# Patient Record
Sex: Male | Born: 1965 | Race: Black or African American | Hispanic: No | Marital: Married | Smoking: Light tobacco smoker
Health system: Southern US, Community
[De-identification: ages and names within clinical notes are randomized; demographics above are authoritative.]

## PROBLEM LIST (undated history)

## (undated) DIAGNOSIS — I10 Essential (primary) hypertension: Secondary | ICD-10-CM

## (undated) DIAGNOSIS — G4730 Sleep apnea, unspecified: Secondary | ICD-10-CM

## (undated) DIAGNOSIS — R112 Nausea with vomiting, unspecified: Secondary | ICD-10-CM

## (undated) DIAGNOSIS — B350 Tinea barbae and tinea capitis: Secondary | ICD-10-CM

## (undated) DIAGNOSIS — R918 Other nonspecific abnormal finding of lung field: Secondary | ICD-10-CM

## (undated) DIAGNOSIS — Z9889 Other specified postprocedural states: Secondary | ICD-10-CM

---

## 2014-05-01 ENCOUNTER — Ambulatory Visit: Payer: Self-pay | Admitting: Family Medicine

## 2014-05-08 ENCOUNTER — Ambulatory Visit: Payer: Self-pay | Admitting: Family Medicine

## 2018-12-10 ENCOUNTER — Other Ambulatory Visit: Payer: Self-pay

## 2019-04-30 ENCOUNTER — Other Ambulatory Visit: Payer: Self-pay | Admitting: Sports Medicine

## 2019-04-30 DIAGNOSIS — M25511 Pain in right shoulder: Principal | ICD-10-CM

## 2019-04-30 DIAGNOSIS — S4991XA Unspecified injury of right shoulder and upper arm, initial encounter: Secondary | ICD-10-CM

## 2019-04-30 DIAGNOSIS — G8929 Other chronic pain: Secondary | ICD-10-CM

## 2019-04-30 DIAGNOSIS — M19011 Primary osteoarthritis, right shoulder: Secondary | ICD-10-CM

## 2019-06-02 ENCOUNTER — Other Ambulatory Visit: Payer: Self-pay

## 2019-06-02 ENCOUNTER — Ambulatory Visit
Admission: RE | Admit: 2019-06-02 | Discharge: 2019-06-02 | Disposition: A | Discharge status: Home / Self Care | Payer: Managed Care, Other (non HMO) | Source: Ambulatory Visit | Attending: Sports Medicine | Admitting: Sports Medicine

## 2019-06-02 DIAGNOSIS — M25511 Pain in right shoulder: Secondary | ICD-10-CM | POA: Insufficient documentation

## 2019-06-02 DIAGNOSIS — G8929 Other chronic pain: Secondary | ICD-10-CM | POA: Insufficient documentation

## 2019-06-02 DIAGNOSIS — M19011 Primary osteoarthritis, right shoulder: Secondary | ICD-10-CM | POA: Diagnosis present

## 2019-06-02 DIAGNOSIS — S4991XA Unspecified injury of right shoulder and upper arm, initial encounter: Secondary | ICD-10-CM | POA: Diagnosis present

## 2019-06-24 ENCOUNTER — Encounter
Admission: RE | Admit: 2019-06-24 | Discharge: 2019-06-24 | Disposition: A | Discharge status: Home / Self Care | Payer: Managed Care, Other (non HMO) | Source: Ambulatory Visit | Attending: Orthopedic Surgery | Admitting: Orthopedic Surgery

## 2019-06-24 ENCOUNTER — Other Ambulatory Visit: Payer: Self-pay

## 2019-06-24 HISTORY — DX: Essential (primary) hypertension: I10

## 2019-06-24 NOTE — Patient Instructions (Addendum)
Your procedure is scheduled on: 06/30/2019 Mon Report to Same Day Surgery 2nd floor medical mall [facility](Medical Mall Entrance-take elevator on left to 2nd floor.  Check in with surgery information desk.) To find out your arrival time please call [phone] between 1PM - 3PM on 06/27/2019 Fri  Remember: Instructions that are not followed completely may result in serious medical risk, up to and including death, or upon the discretion of your surgeon and anesthesiologist your surgery may need to be rescheduled.    _x___ 1. Do not eat food after midnight the night before your procedure. You may drink clear liquids up to 2 hours before you are scheduled to arrive at the hospital for your procedure.  Do not drink clear liquids within 2 hours of your scheduled arrival to the hospital.  Clear liquids include  --Water or Apple juice without pulp  --Clear carbohydrate beverage such as ClearFast or Gatorade  --Black Coffee or Clear Tea (No milk, no creamers, do not add anything to                  the coffee or Tea Type 1 and type 2 diabetics should only drink water.   ____Ensure clear carbohydrate drink on the way to the hospital for bariatric patients  ____Ensure clear carbohydrate drink 3 hours before surgery for Dr Rutherford NailByrnett's patients if physician instructed.   No gum chewing or hard candies.     __x__ 2. No Alcohol for 24 hours before or after surgery.   __x__3. No Smoking or e-cigarettes for 24 prior to surgery.  Do not use any chewable tobacco products for at least 6 hour prior to surgery   ____  4. Bring all medications with you on the day of surgery if instructed.    __x__ 5. Notify your doctor if there is any change in your medical condition     (cold, fever, infections).    x___6. On the morning of surgery brush your teeth with toothpaste and water.  You may rinse your mouth with mouth wash if you wish.  Do not swallow any toothpaste or mouthwash.   Do not wear jewelry, make-up, hairpins,  clips or nail polish.  Do not wear lotions, powders, or perfumes. You may wear deodorant.  Do not shave 48 hours prior to surgery. Men may shave face and neck.  Do not bring valuables to the hospital.    [facility] is not responsible for any belongings or valuables.               Contacts, dentures or bridgework may not be worn into surgery.  Leave your suitcase in the car. After surgery it may be brought to your room.  For patients admitted to the hospital, discharge time is determined by your                       treatment team.  _  Patients discharged the day of surgery will not be allowed to drive home.  You will need someone to drive you home and stay with you the night of your procedure.    Please read over the following fact sheets that you were given:   [facility] Pre[facility] and or MRSA Information   _x___ Take anti-hypertensive listed below, cardiac, seizure, asthma,     anti-reflux and psychiatric medicines. These include:  1. none  2.  3.  4.  5.  6.  ____Fleets enema or Magnesium Citrate as directed.  _x___ Use CHG Soap or sage wipes as directed on instruction sheet   ____ Use inhalers on the day of surgery and bring to hospital day of surgery  ____ Stop Metformin and Janumet 2 days prior to surgery.    ____ Take 1/2 of usual insulin dose the night before surgery and none on the morning     surgery.   _x___ Follow recommendations from Cardiologist, Pulmonologist or PCP regarding          stopping Aspirin, Coumadin, Plavix ,Eliquis, Effient, or Pradaxa, and Pletal.  X____Stop Anti-inflammatories such as Advil, Aleve, Ibuprofen, Motrin, Naproxen, Naprosyn, Goodies powders or aspirin products. OK to take Tylenol and                          Celebrex.   _x___ Stop supplements until after surgery.  But may continue Vitamin D, Vitamin B,       and multivitamin.n Stop fish oil and garlic today   _x___ Bring C-Pap to the hospital.

## 2019-06-26 ENCOUNTER — Other Ambulatory Visit: Payer: Self-pay

## 2019-06-26 ENCOUNTER — Encounter
Admission: RE | Admit: 2019-06-26 | Discharge: 2019-06-26 | Disposition: A | Discharge status: Home / Self Care | Payer: Managed Care, Other (non HMO) | Source: Ambulatory Visit | Attending: Orthopedic Surgery | Admitting: Orthopedic Surgery

## 2019-06-26 DIAGNOSIS — R9431 Abnormal electrocardiogram [ECG] [EKG]: Secondary | ICD-10-CM | POA: Diagnosis not present

## 2019-06-26 DIAGNOSIS — Z1159 Encounter for screening for other viral diseases: Secondary | ICD-10-CM | POA: Diagnosis not present

## 2019-06-26 DIAGNOSIS — I10 Essential (primary) hypertension: Secondary | ICD-10-CM

## 2019-06-26 DIAGNOSIS — Z01818 Encounter for other preprocedural examination: Secondary | ICD-10-CM | POA: Diagnosis present

## 2019-06-26 LAB — SARS CORONAVIRUS 2 (TAT 6-24 HRS): SARS Coronavirus 2: NEGATIVE

## 2019-06-30 ENCOUNTER — Other Ambulatory Visit: Payer: Self-pay

## 2019-06-30 ENCOUNTER — Ambulatory Visit
Admission: RE | Admit: 2019-06-30 | Discharge: 2019-06-30 | Disposition: A | Discharge status: Home / Self Care | Payer: Managed Care, Other (non HMO) | Attending: Orthopedic Surgery | Admitting: Orthopedic Surgery

## 2019-06-30 ENCOUNTER — Ambulatory Visit: Payer: Managed Care, Other (non HMO) | Admitting: Anesthesiology

## 2019-06-30 ENCOUNTER — Encounter
Admission: RE | Disposition: A | Discharge status: Home / Self Care | Payer: Self-pay | Source: Home / Self Care | Attending: Orthopedic Surgery

## 2019-06-30 ENCOUNTER — Encounter: Payer: Self-pay | Admitting: Emergency Medicine

## 2019-06-30 DIAGNOSIS — I10 Essential (primary) hypertension: Secondary | ICD-10-CM | POA: Diagnosis not present

## 2019-06-30 DIAGNOSIS — F17210 Nicotine dependence, cigarettes, uncomplicated: Secondary | ICD-10-CM | POA: Diagnosis not present

## 2019-06-30 DIAGNOSIS — Z79899 Other long term (current) drug therapy: Secondary | ICD-10-CM | POA: Insufficient documentation

## 2019-06-30 DIAGNOSIS — Y9323 Activity, snow (alpine) (downhill) skiing, snow boarding, sledding, tobogganing and snow tubing: Secondary | ICD-10-CM | POA: Diagnosis not present

## 2019-06-30 DIAGNOSIS — W000XXA Fall on same level due to ice and snow, initial encounter: Secondary | ICD-10-CM | POA: Diagnosis not present

## 2019-06-30 DIAGNOSIS — M65811 Other synovitis and tenosynovitis, right shoulder: Secondary | ICD-10-CM | POA: Diagnosis not present

## 2019-06-30 DIAGNOSIS — M7581 Other shoulder lesions, right shoulder: Secondary | ICD-10-CM | POA: Insufficient documentation

## 2019-06-30 DIAGNOSIS — M7541 Impingement syndrome of right shoulder: Secondary | ICD-10-CM | POA: Diagnosis not present

## 2019-06-30 DIAGNOSIS — S46012A Strain of muscle(s) and tendon(s) of the rotator cuff of left shoulder, initial encounter: Secondary | ICD-10-CM | POA: Diagnosis present

## 2019-06-30 DIAGNOSIS — G4730 Sleep apnea, unspecified: Secondary | ICD-10-CM | POA: Insufficient documentation

## 2019-06-30 HISTORY — PX: SHOULDER ARTHROSCOPY WITH SUBACROMIAL DECOMPRESSION, ROTATOR CUFF REPAIR AND BICEP TENDON REPAIR: SHX5687

## 2019-06-30 HISTORY — DX: Other specified postprocedural states: Z98.890

## 2019-06-30 HISTORY — DX: Nausea with vomiting, unspecified: R11.2

## 2019-06-30 SURGERY — SHOULDER ARTHROSCOPY WITH SUBACROMIAL DECOMPRESSION, ROTATOR CUFF REPAIR AND BICEP TENDON REPAIR
Anesthesia: General | Site: Shoulder | Laterality: Right

## 2019-06-30 MED ORDER — SODIUM CHLORIDE FLUSH 0.9 % IV SOLN
INTRAVENOUS | Status: AC
  Filled 2019-06-30: qty 10

## 2019-06-30 MED ORDER — ONDANSETRON 4 MG PO TBDP: 4.0000 mg | ORAL_TABLET | Freq: Three times a day (TID) | ORAL | 0 refills | Status: DC | PRN

## 2019-06-30 MED ORDER — MIDAZOLAM HCL 2 MG/2ML IJ SOLN
INTRAMUSCULAR | Status: DC | PRN
  Administered 2019-06-30: 2 mg via INTRAVENOUS

## 2019-06-30 MED ORDER — PROMETHAZINE HCL 25 MG/ML IJ SOLN
6.2500 mg | Freq: Once | INTRAMUSCULAR | Status: AC
  Administered 2019-06-30: 12:00:00 6.25 mg via INTRAVENOUS

## 2019-06-30 MED ORDER — MIDAZOLAM HCL 2 MG/2ML IJ SOLN
1.0000 mg | Freq: Once | INTRAMUSCULAR | Status: AC
  Administered 2019-06-30: 1 mg via INTRAVENOUS

## 2019-06-30 MED ORDER — MIDAZOLAM HCL 2 MG/2ML IJ SOLN
INTRAMUSCULAR | Status: AC
  Filled 2019-06-30: qty 2

## 2019-06-30 MED ORDER — ROCURONIUM BROMIDE 100 MG/10ML IV SOLN
INTRAVENOUS | Status: DC | PRN
  Administered 2019-06-30: 50 mg via INTRAVENOUS
  Administered 2019-06-30: 30 mg via INTRAVENOUS

## 2019-06-30 MED ORDER — FENTANYL CITRATE (PF) 100 MCG/2ML IJ SOLN
INTRAMUSCULAR | Status: AC
  Administered 2019-06-30: 50 ug via INTRAVENOUS
  Filled 2019-06-30: qty 2

## 2019-06-30 MED ORDER — BUPIVACAINE HCL (PF) 0.5 % IJ SOLN
INTRAMUSCULAR | Status: DC | PRN
  Administered 2019-06-30: 7 mL via PERINEURAL
  Administered 2019-06-30: 3 mL via PERINEURAL

## 2019-06-30 MED ORDER — BUPIVACAINE LIPOSOME 1.3 % IJ SUSP
INTRAMUSCULAR | Status: AC
  Filled 2019-06-30: qty 20

## 2019-06-30 MED ORDER — EPINEPHRINE (ANAPHYLAXIS) 30 MG/30ML IJ SOLN
INTRAMUSCULAR | Status: AC
  Filled 2019-06-30: qty 30

## 2019-06-30 MED ORDER — FAMOTIDINE 20 MG PO TABS
ORAL_TABLET | ORAL | Status: AC
  Administered 2019-06-30: 20 mg via ORAL
  Filled 2019-06-30: qty 1

## 2019-06-30 MED ORDER — CEFAZOLIN SODIUM-DEXTROSE 2-4 GM/100ML-% IV SOLN
2.0000 g | Freq: Once | INTRAVENOUS | Status: AC
  Administered 2019-06-30: 2 g via INTRAVENOUS

## 2019-06-30 MED ORDER — BUPIVACAINE LIPOSOME 1.3 % IJ SUSP
INTRAMUSCULAR | Status: DC | PRN
  Administered 2019-06-30: 13 mL via PERINEURAL
  Administered 2019-06-30: 7 mL via PERINEURAL

## 2019-06-30 MED ORDER — PHENYLEPHRINE HCL (PRESSORS) 10 MG/ML IV SOLN
INTRAVENOUS | Status: DC | PRN
  Administered 2019-06-30: 50 ug via INTRAVENOUS

## 2019-06-30 MED ORDER — SUGAMMADEX SODIUM 200 MG/2ML IV SOLN
INTRAVENOUS | Status: DC | PRN
  Administered 2019-06-30: 150 mg via INTRAVENOUS

## 2019-06-30 MED ORDER — CEFAZOLIN SODIUM-DEXTROSE 2-4 GM/100ML-% IV SOLN
INTRAVENOUS | Status: AC
  Filled 2019-06-30: qty 100

## 2019-06-30 MED ORDER — SUGAMMADEX SODIUM 200 MG/2ML IV SOLN
INTRAVENOUS | Status: AC
  Filled 2019-06-30: qty 2

## 2019-06-30 MED ORDER — PROMETHAZINE HCL 25 MG/ML IJ SOLN
INTRAMUSCULAR | Status: AC
  Administered 2019-06-30: 6.25 mg via INTRAVENOUS
  Filled 2019-06-30: qty 1

## 2019-06-30 MED ORDER — LIDOCAINE HCL (PF) 2 % IJ SOLN
INTRAMUSCULAR | Status: AC
  Filled 2019-06-30: qty 10

## 2019-06-30 MED ORDER — OXYCODONE HCL 5 MG PO TABS: 5.0000 mg | ORAL_TABLET | Freq: Once | ORAL | Status: DC | PRN

## 2019-06-30 MED ORDER — FENTANYL CITRATE (PF) 100 MCG/2ML IJ SOLN: 25.0000 ug | INTRAMUSCULAR | Status: DC | PRN

## 2019-06-30 MED ORDER — OXYCODONE HCL 5 MG/5ML PO SOLN: 5.0000 mg | Freq: Once | ORAL | Status: DC | PRN

## 2019-06-30 MED ORDER — FENTANYL CITRATE (PF) 100 MCG/2ML IJ SOLN
50.0000 ug | Freq: Once | INTRAMUSCULAR | Status: AC
  Administered 2019-06-30: 50 ug via INTRAVENOUS

## 2019-06-30 MED ORDER — LACTATED RINGERS IV SOLN
INTRAVENOUS | Status: DC
  Administered 2019-06-30: 06:00:00 via INTRAVENOUS

## 2019-06-30 MED ORDER — OXYCODONE HCL 5 MG PO TABS: 5.0000 mg | ORAL_TABLET | ORAL | 0 refills | Status: AC | PRN

## 2019-06-30 MED ORDER — ONDANSETRON HCL 4 MG/2ML IJ SOLN
INTRAMUSCULAR | Status: DC | PRN
  Administered 2019-06-30: 4 mg via INTRAVENOUS

## 2019-06-30 MED ORDER — BUPIVACAINE HCL (PF) 0.5 % IJ SOLN
INTRAMUSCULAR | Status: AC
  Filled 2019-06-30: qty 10

## 2019-06-30 MED ORDER — LACTATED RINGERS IV SOLN
INTRAVENOUS | Status: DC | PRN
  Administered 2019-06-30: 10 mL

## 2019-06-30 MED ORDER — MIDAZOLAM HCL 2 MG/2ML IJ SOLN
INTRAMUSCULAR | Status: AC
  Administered 2019-06-30: 1 mg via INTRAVENOUS
  Filled 2019-06-30: qty 2

## 2019-06-30 MED ORDER — BUPIVACAINE HCL (PF) 0.5 % IJ SOLN
INTRAMUSCULAR | Status: AC
  Filled 2019-06-30: qty 30

## 2019-06-30 MED ORDER — ASPIRIN EC 325 MG PO TBEC: 325.0000 mg | DELAYED_RELEASE_TABLET | Freq: Every day | ORAL | 0 refills | Status: AC

## 2019-06-30 MED ORDER — ACETAMINOPHEN 500 MG PO TABS: 1000.0000 mg | ORAL_TABLET | Freq: Three times a day (TID) | ORAL | 2 refills | Status: AC

## 2019-06-30 MED ORDER — LIDOCAINE HCL (PF) 1 % IJ SOLN
INTRAMUSCULAR | Status: AC
  Filled 2019-06-30: qty 5

## 2019-06-30 MED ORDER — LACTATED RINGERS IV SOLN
INTRAVENOUS | Status: DC | PRN
  Administered 2019-06-30: 10:00:00 via INTRAVENOUS

## 2019-06-30 MED ORDER — ONDANSETRON HCL 4 MG/2ML IJ SOLN
INTRAMUSCULAR | Status: AC
  Filled 2019-06-30: qty 2

## 2019-06-30 MED ORDER — LIDOCAINE HCL (CARDIAC) PF 100 MG/5ML IV SOSY
PREFILLED_SYRINGE | INTRAVENOUS | Status: DC | PRN
  Administered 2019-06-30: 60 mg via INTRAVENOUS

## 2019-06-30 MED ORDER — PROPOFOL 10 MG/ML IV BOLUS
INTRAVENOUS | Status: DC | PRN
  Administered 2019-06-30: 150 mg via INTRAVENOUS

## 2019-06-30 MED ORDER — LIDOCAINE-EPINEPHRINE (PF) 1 %-1:200000 IJ SOLN
INTRAMUSCULAR | Status: AC
  Filled 2019-06-30: qty 30

## 2019-06-30 MED ORDER — FENTANYL CITRATE (PF) 100 MCG/2ML IJ SOLN
INTRAMUSCULAR | Status: DC | PRN
  Administered 2019-06-30 (×2): 50 ug via INTRAVENOUS

## 2019-06-30 MED ORDER — FENTANYL CITRATE (PF) 100 MCG/2ML IJ SOLN
INTRAMUSCULAR | Status: AC
  Filled 2019-06-30: qty 2

## 2019-06-30 MED ORDER — DEXAMETHASONE SODIUM PHOSPHATE 10 MG/ML IJ SOLN
INTRAMUSCULAR | Status: DC | PRN
  Administered 2019-06-30: 5 mg via INTRAVENOUS

## 2019-06-30 MED ORDER — ROCURONIUM BROMIDE 50 MG/5ML IV SOLN
INTRAVENOUS | Status: AC
  Filled 2019-06-30: qty 1

## 2019-06-30 MED ORDER — PROPOFOL 10 MG/ML IV BOLUS
INTRAVENOUS | Status: AC
  Filled 2019-06-30: qty 40

## 2019-06-30 MED ORDER — DEXAMETHASONE SODIUM PHOSPHATE 10 MG/ML IJ SOLN
INTRAMUSCULAR | Status: AC
  Filled 2019-06-30: qty 1

## 2019-06-30 MED ORDER — FAMOTIDINE 20 MG PO TABS
20.0000 mg | ORAL_TABLET | Freq: Once | ORAL | Status: AC
  Administered 2019-06-30: 06:00:00 20 mg via ORAL

## 2019-06-30 MED ORDER — LIDOCAINE-EPINEPHRINE 1 %-1:100000 IJ SOLN
INTRAMUSCULAR | Status: AC
  Filled 2019-06-30: qty 1

## 2019-06-30 SURGICAL SUPPLY — 85 items
ADAPTER IRRIG TUBE 2 SPIKE SOL (ADAPTER) ×6 IMPLANT
ANCHOR 2.3 SP SGL 1.2 XBRAID (Anchor) ×8 IMPLANT
ANCHOR 2.3MM SP SGL 1.2 XBRAID (Anchor) ×4 IMPLANT
ANCHOR SUT BIO SW 4.75X19.1 (Anchor) ×9 IMPLANT
ANCHOR SUT FBRTK SUTURETAP 1.3 (Anchor) ×3 IMPLANT
ARTHREX INC FIBER TAPE 2MM ×2 IMPLANT
BIT DRILL RIGD1.8MM FBRTK STRL (DRILL) ×1 IMPLANT
BLADE OSCILLATING/SAGITTAL (BLADE)
BLADE SW THK.38XMED LNG THN (BLADE) IMPLANT
BUR BR 5.5 12 FLUTE (BURR) ×3 IMPLANT
BUR RADIUS 4.0X18.5 (BURR) ×3 IMPLANT
CANNULA 5.75X7CM (CANNULA)
CANNULA PART THRD DISP 5.75X7 (CANNULA) IMPLANT
CANNULA PARTIAL THREAD 2X7 (CANNULA) ×3 IMPLANT
CANNULA TWIST IN 8.25X9CM (CANNULA) IMPLANT
CHLORAPREP W/TINT 26 (MISCELLANEOUS) ×3 IMPLANT
CLOSURE WOUND 1/2 X4 (GAUZE/BANDAGES/DRESSINGS) ×1
COOLER POLAR GLACIER W/PUMP (MISCELLANEOUS) ×3 IMPLANT
COVER WAND RF STERILE (DRAPES) ×3 IMPLANT
CRADLE LAMINECT ARM (MISCELLANEOUS) ×3 IMPLANT
DERMABOND ADVANCED (GAUZE/BANDAGES/DRESSINGS) ×2
DERMABOND ADVANCED .7 DNX12 (GAUZE/BANDAGES/DRESSINGS) ×1 IMPLANT
DRAPE IMP U-DRAPE 54X76 (DRAPES) ×6 IMPLANT
DRAPE INCISE IOBAN 66X45 STRL (DRAPES) ×3 IMPLANT
DRAPE SHEET LG 3/4 BI-LAMINATE (DRAPES) ×3 IMPLANT
DRAPE STERI 35X30 U-POUCH (DRAPES) ×3 IMPLANT
DRAPE U-SHAPE 47X51 STRL (DRAPES) ×6 IMPLANT
DRILL RIGID 1.8MM FBRTK STRL (DRILL) ×3
DRSG TEGADERM 4X4.75 (GAUZE/BANDAGES/DRESSINGS) ×9 IMPLANT
ELECT REM PT RETURN 9FT ADLT (ELECTROSURGICAL) ×3
ELECTRODE REM PT RTRN 9FT ADLT (ELECTROSURGICAL) ×1 IMPLANT
FIBER TAPE 2MM (SUTURE) ×3 IMPLANT
GAUZE SPONGE 4X4 12PLY STRL (GAUZE/BANDAGES/DRESSINGS) ×3 IMPLANT
GAUZE XEROFORM 1X8 LF (GAUZE/BANDAGES/DRESSINGS) ×3 IMPLANT
GLOVE BIO SURGEON STRL SZ7.5 (GLOVE) ×3 IMPLANT
GLOVE BIOGEL PI IND STRL 8 (GLOVE) ×4 IMPLANT
GLOVE BIOGEL PI INDICATOR 8 (GLOVE) ×8
GLOVE SURG SYN 8.0 (GLOVE) ×6 IMPLANT
GOWN STRL REUS W/ TWL LRG LVL3 (GOWN DISPOSABLE) ×3 IMPLANT
GOWN STRL REUS W/TWL LRG LVL3 (GOWN DISPOSABLE) ×6
GOWN STRL REUS W/TWL LRG LVL4 (GOWN DISPOSABLE) ×3 IMPLANT
IV LACTATED RINGER IRRG 3000ML (IV SOLUTION) ×20
IV LR IRRIG 3000ML ARTHROMATIC (IV SOLUTION) ×10 IMPLANT
KIT SPEAR STR 1.6MM DRILL (MISCELLANEOUS) ×3 IMPLANT
KIT STABILIZATION SHOULDER (MISCELLANEOUS) ×3 IMPLANT
KIT SUTURETAK 3.0 INSERT PERC (KITS) IMPLANT
KIT TURNOVER KIT A (KITS) ×3 IMPLANT
MANIFOLD NEPTUNE II (INSTRUMENTS) ×3 IMPLANT
MASK FACE SPIDER DISP (MASK) ×3 IMPLANT
MAT ABSORB  FLUID 56X50 GRAY (MISCELLANEOUS) ×4
MAT ABSORB FLUID 56X50 GRAY (MISCELLANEOUS) ×2 IMPLANT
NDL MAYO CATGUT SZ5 (NEEDLE) ×2
NDL SAFETY ECLIPSE 18X1.5 (NEEDLE) ×1 IMPLANT
NDL SUT 5 .5 CRC TPR PNT MAYO (NEEDLE) ×1 IMPLANT
NEEDLE HYPO 18GX1.5 SHARP (NEEDLE) ×2
NEEDLE HYPO 22GX1.5 SAFETY (NEEDLE) ×3 IMPLANT
NEEDLE MAYO CATGUT SZ 1.5 (NEEDLE) ×3
NEEDLE MAYO CATGUT SZ 2 (NEEDLE) ×1 IMPLANT
NEEDLE SCORPION MULTI FIRE (NEEDLE) ×3 IMPLANT
PACK ARTHROSCOPY SHOULDER (MISCELLANEOUS) ×3 IMPLANT
PAD ABD DERMACEA PRESS 5X9 (GAUZE/BANDAGES/DRESSINGS) ×3 IMPLANT
PAD WRAPON POLAR SHDR XLG (MISCELLANEOUS) ×1 IMPLANT
SET TUBE SUCT SHAVER OUTFL 24K (TUBING) ×3 IMPLANT
SET TUBE TIP INTRA-ARTICULAR (MISCELLANEOUS) ×3 IMPLANT
SLING ULTRA II M (MISCELLANEOUS) IMPLANT
STAPLER SKIN PROX 35W (STAPLE) IMPLANT
STRAP SAFETY 5IN WIDE (MISCELLANEOUS) ×3 IMPLANT
STRIP CLOSURE SKIN 1/2X4 (GAUZE/BANDAGES/DRESSINGS) ×2 IMPLANT
SUT ETHILON 3-0 (SUTURE) ×3 IMPLANT
SUT LASSO 90 DEG SD STR (SUTURE) IMPLANT
SUT MNCRL 4-0 (SUTURE) ×2
SUT MNCRL 4-0 27XMFL (SUTURE) ×1
SUT PROLENE 0 CT 2 (SUTURE) IMPLANT
SUT VIC AB 0 CT1 36 (SUTURE) ×3 IMPLANT
SUT VIC AB 2-0 CT2 27 (SUTURE) ×3 IMPLANT
SUT VICRYL 3-0 27IN (SUTURE) ×3 IMPLANT
SUTURE MNCRL 4-0 27XMF (SUTURE) ×1 IMPLANT
SYR 10ML LL (SYRINGE) ×3 IMPLANT
TAPE CLOTH 3X10 WHT NS LF (GAUZE/BANDAGES/DRESSINGS) ×3 IMPLANT
TAPE MICROFOAM 4IN (TAPE) ×3 IMPLANT
TUBING ARTHRO INFLOW-ONLY STRL (TUBING) ×3 IMPLANT
TUBING CONNECTING 10 (TUBING) IMPLANT
TUBING CONNECTING 10' (TUBING)
WAND WEREWOLF FLOW 90D (MISCELLANEOUS) ×3 IMPLANT
WRAPON POLAR PAD SHDR XLG (MISCELLANEOUS) ×3

## 2019-06-30 NOTE — Discharge Instructions (Signed)
Post-Op Instructions - Rotator Cuff Repair ° °1. Bracing: You will wear a shoulder immobilizer or sling for 6 weeks.  ° °2. Driving: No driving for 3 weeks post-op. When driving, do not wear the immobilizer. Ideally, we recommend no driving for 6 weeks while sling is in place as one arm will be immobilized.  ° °3. Activity: No active lifting for 2 months. Wrist, hand, and elbow motion only. Avoid lifting the upper arm away from the body except for hygiene. You are permitted to bend and straighten the elbow passively only (no active elbow motion). You may use your hand and wrist for typing, writing, and managing utensils (cutting food). Do not lift more than a coffee cup for 8 weeks.  When sleeping or resting, inclined positions (recliner chair or wedge pillow) and a pillow under the forearm for support may provide better comfort for up to 4 weeks.  Avoid long distance travel for 4 weeks. ° °Return to normal activities after rotator cuff repair repair normally takes 6 months on average. If rehab goes very well, may be able to do most activities at 4 months, except overhead or contact sports. ° °4. Physical Therapy: Begins 3-4 days after surgery, and proceed 1 time per week for the first 6 weeks, then 1-2 times per week from weeks 6-20 post-op. ° °5. Medications:  °- You will be provided a prescription for narcotic pain medicine. After surgery, take 1-2 narcotic tablets every 4 hours if needed for severe pain.  °- A prescription for anti-nausea medication will be provided in case the narcotic medicine causes nausea - take 1 tablet every 6 hours only if nauseated.   °- Take tylenol 1000 mg (2 Extra Strength tablets or 3 regular strength) every 8 hours for pain.  May decrease or stop tylenol 5 days after surgery if you are having minimal pain. °- Take ASA 325mg/day x 2 weeks to help prevent DVTs/PEs (blood clots).  °- DO NOT take ANY nonsteroidal anti-inflammatory pain medications (Advil, Motrin, Ibuprofen, Aleve,  Naproxen, or Naprosyn). These medicines can inhibit healing of your shoulder repair.  ° ° °If you are taking prescription medication for anxiety, depression, insomnia, muscle spasm, chronic pain, or for attention deficit disorder, you are advised that you are at a higher risk of adverse effects with use of narcotics post-op, including narcotic addiction/dependence, depressed breathing, death. °If you use non-prescribed substances: alcohol, marijuana, cocaine, heroin, methamphetamines, etc., you are at a higher risk of adverse effects with use of narcotics post-op, including narcotic addiction/dependence, depressed breathing, death. °You are advised that taking > 50 morphine milligram equivalents (MME) of narcotic pain medication per day results in twice the risk of overdose or death. For your prescription provided: oxycodone 5 mg - taking more than 6 tablets per day would result in > 50 morphine milligram equivalents (MME) of narcotic pain medication. °Be advised that we will prescribe narcotics short-term, for acute post-operative pain only - 3 weeks for major operations such as shoulder repair/reconstruction surgeries.  ° ° ° °6. Post-Op Appointment: ° °Your first post-op appointment will be 10-14 days post-op. ° °7. Work or School: For most, but not all procedures, we advise staying out of work or school for at least 1 to 2 weeks in order to recover from the stress of surgery and to allow time for healing.  ° °If you need a work or school note this can be provided.  ° °8. Smoking: If you are a smoker, you need to refrain from   smoking in the postoperative period. The nicotine in cigarettes will inhibit healing of your shoulder repair and decrease the chance of successful repair. Similarly, nicotine containing products (gum, patches) should be avoided.  ° °Post-operative Brace: °Apply and remove the brace you received as you were instructed to at the time of fitting and as described in detail as the brace’s  instructions for use indicate.  Wear the brace for the period of time prescribed by your physician.  The brace can be cleaned with soap and water and allowed to air dry only.  Should the brace result in increased pain, decreased feeling (numbness/tingling), increased swelling or an overall worsening of your medical condition, please contact your doctor immediately.  If an emergency situation occurs as a result of wearing the brace after normal business hours, please dial 911 and seek immediate medical attention.  Let your doctor know if you have any further questions about the brace issued to you. °Refer to the shoulder sling instructions for use if you have any questions regarding the correct fit of your shoulder sling.  °[facility] Customer Care for Troubleshooting: [phone] ° °Video that illustrates how to properly use a shoulder sling: °"Instructions for Proper Use of an Orthopaedic Sling" °https://www.youtube.com/watch?v=AHZpn_Xo45w ° ° °AMBULATORY SURGERY  °DISCHARGE INSTRUCTIONS ° ° °1) The drugs that you were given will stay in your system until tomorrow so for the next 24 hours you should not: ° °A) Drive an automobile °B) Make any legal decisions °C) Drink any alcoholic beverage ° ° °2) You may resume regular meals tomorrow.  Today it is better to start with liquids and gradually work up to solid foods. ° °You may eat anything you prefer, but it is better to start with liquids, then soup and crackers, and gradually work up to solid foods. ° ° °3) Please notify your doctor immediately if you have any unusual bleeding, trouble breathing, redness and pain at the surgery site, drainage, fever, or pain not relieved by medication. ° ° ° °4) Additional Instructions: ° ° ° ° ° ° ° °Please contact your physician with any problems or Same Day Surgery at [phone], Monday through Friday 6 am to 4 pm, or [facility] Main number at [phone]. ° ° °

## 2019-06-30 NOTE — Op Note (Signed)
SURGERY DATE: 06/30/2019  PRE-OP DIAGNOSIS:  1. Right rotator cuff tear (subscapularis, supraspinatus, infraspinatus) 2. Right subacromial impingement 3. Right biceps tendinopathy  POST-OP DIAGNOSIS: 1. Right rotator cuff tear (subscapularis, supraspinatus, infraspinatus) 2. Right subacromial impingement 3. Right biceps tendinopathy  PROCEDURES:  1. Right arthroscopic rotator cuff repair (subscapularis) 2. Right mini-open rotator cuff repair (supraspinatus and infraspinatus) 3. Right open biceps tenodesis 4. Right arthroscopic extensive debridement of shoulder (glenohumeral and subacromial spaces) 5. Right arthroscopic subacromial decompression  SURGEON: Rosealee AlbeeSunny H. Harbor Vanover, MD  ASSISTANT: none  ANESTHESIA: Gen with Exparil interscalene block  ESTIMATED BLOOD LOSS: 25cc  DRAINS:  none  TOTAL IV FLUIDS: per anesthesia   SPECIMENS: none  IMPLANTS:  - Arthrex 4.6975mm SwiveLock x 3 - Arthrex FiberTak Suture Anchor - Double Loaded - Iconix SPEED double loaded with 1.2 and 2.850mm tape x 3   OPERATIVE FINDINGS:  Examination under anesthesia: A careful examination under anesthesia was performed.  Passive range of motion was: FF: 160; ER at side: 50; ER in abduction: 95; IR in abduction: 50.  Anterior load shift: NT.  Posterior load shift: NT.  Sulcus in neutral: NT.  Sulcus in ER: NT.    Intra-operative findings: A thorough arthroscopic examination of the shoulder was performed.  The findings are: 1. Biceps tendon: Significant tendinopathy 2. Superior labrum: injected with surrounding synovitis 3. Posterior labrum and capsule: normal 4. Inferior capsule and inferior recess: normal 5. Glenoid cartilage surface: Normal 6. Supraspinatus attachment: full-thickness tear 7. Posterior rotator cuff attachment: partial tear of anterior infraspinatus 8. Humeral head articular cartilage: normal 9. Rotator interval: significant synovitis 10: Subscapularis tendon: Partial-thickness tearing of  the superior fibers 11. Anterior labrum: degenerative 12. IGHL: significant synovitis around IGHL  OPERATIVE REPORT:   Indications for procedure: Marvin Cox is a 53 y.o. male with ~7 months of L shoulder pain that began after he fell while skiing. Since that time, he has failed non-operative management including activity modification, medical management, and corticosteroid injections without adequate relief of symptoms. Clinical exam and MRI were suggestive of rotator cuff tear including subscapularis, supraspinatus, and infraspinatus tears; subacromial impingement; and biceps tendinopathy. After discussion of risks, benefits, and alternatives to surgery, the patient elected to proceed.  We also discussed preoperatively that he should discontinue smoking.  The patient states that he smoked only approximately 1-2 cigarettes/month and that he would be able to quit.  Procedure in detail:  I identified Marvin Cox in the pre-operative holding area.  I marked the operative shoulder with my initials. I reviewed the risks and benefits of the proposed surgical intervention, and the patient (and/or patient's guardian) wished to proceed.  Anesthesia was then performed with an Exparil interscalene block.  The patient was transferred to the operative suite and placed in the beach chair position.    SCDs were placed on the lower extremities. Appropriate IV antibiotics were administered prior to incision. The operative upper extremity was then prepped and draped in standard fashion. A time out was performed confirming the correct extremity, correct patient, and correct procedure.   I then created a standard posterior portal with an 11 blade. The glenohumeral joint was easily entered with a blunt trochar and the arthroscope introduced. The findings of diagnostic arthroscopy are described above.  A standard anterior portal was made.  I debrided degenerative tissue including the synovitic tissue about the  rotator interval and IGHL as well as the anterior and superior labrum. I then coagulated the inflamed synovium to obtain hemostasis and  reduce the risk of post-operative swelling using an Arthrocare radiofrequency device. The biceps tendon was cut at its insertion on the superior labrum with arthroscopic scissors.  The subscapularis tear was identified.  A superior anterolateral portal was made under needle localization.  A 7 mm cannula was placed.   The tip of the coracoid as well as the conjoined tendon and coracoacromial ligaments were visualized after debriding rotator interval tissue.  Tissue about the subscapularis was released anteriorly, superiorly, and posteriorly to allow for improved mobilization.  The lesser tuberosity footprint was prepared with electrocautery to remove all soft tissue.  A Scorpion suture passing device was used to pass a fiber tape in a mattress fashion through the subscapularis tendon.  These were passed from the anterolateral portal and after passage the sutures were retrieved from the anterior portal.  These were passed through a 4.75 mm SwiveLock and placed into the lesser tuberosity at the footprint of the subscapularis tendon with the arm in a neutral position.  This appropriately reduced the superior border partial thickness subscapularis tear.  The arm was then internally and externally rotated and the subscapularis was noted to move appropriately with rotation.  Next, the arthroscope was then introduced into the subacromial space. A direct lateral portal was created with an 11-blade after spinal needle localization. An extensive subacromial bursectomy was performed using a combination of the shaver and Arthrocare wand. The entire acromial undersurface was exposed and the CA ligament was subperiosteally elevated to expose the anterior acromial hook. A 5.815mm barrel burr was used to create a flat anterior and lateral aspect of the acromion, converting it from a Type 2 to a  Type 1 acromion. Care was made to keep the deltoid fascia intact.  A longitudinal incision from the anterolateral acromion ~6cm in length was made overlying the raphe between the anterior and middle heads of the deltoid.  This incision also incorporated the anterolateral portal.  The raphe was identified and it was incised. The subacromial space was identified. Any remaining bursa was excised. The rotator cuff tear involving the supraspinatus and part of the infraspinatus was identified. It was an L-shaped tear with the long limb of the L anterior.  There was an additional delaminated portion with a similar tear pattern.  We then turned our attention to the biceps tenodesis. The arm was externally rotated.  The bicipital groove was identified.  A 15 blade was used to make a cut overlying the biceps tendon, and the tendon was removed using a right angle clamp.  The base of the bicipital groove was identified and cleared of soft tissue.  A FiberTak anchor was placed in the bicipital groove.  The biceps tendon was held at the appropriate amount of tension.  One set of sutures was passed through the biceps anchor with one limb passed in a simple fashion and the second limb passed in a simple plus locking stitch pattern.  This was repeated for the other set of sutures.  This construct allowed for shuttling the biceps tendon down to the bone.  The sutures were tied and cut.  The diseased portion of the proximal biceps was then excised.  The arm was then internally rotated.  The rotator cuff footprint was cleared of soft tissue and the footprint was smoothed and bleeding bone over the footprint was created with a rongeur.  Due to the large size of the tear, three Iconix SPEED anchors were placed just lateral to the articular margin.  The rotator cuff was  held in a reduced position and all limbs of suture were passed appropriately with a scorpion suture passer.  The 1.2 mm tapes from each anchor were tied using a knot  pusher. This nicely reapproximated the rotator cuff back to its footprint. The anterior and middle 1.2 mm tapes were then cut.   Two SwiveLock anchors were placed for the lateral row anchors with all six strands of 2.0 mm tapes and the tied 1.2 mm tape from the posterior anchor passed through these 2 anchors.   Additionally a dog ear at the middle of the rotator cuff tear was reduced by passing the #2 FiberWire from the anterior lateral row anchor in a mattress fashion and tying it down. This construct allowed for excellent reapproximation and compression of the rotator cuff over its footprint.   The wound was thoroughly irrigated.  The deltoid split was closed with 0 Vicryl.  The subdermal layer was closed with 2-0 Vicryl.  The skin was closed with 4-0 Monocryl and Dermabond. The portals were closed with 3-0 Nylon. Xeroform was applied to the incisions. A sterile dressing was applied, followed by a Polar Care sleeve and a SlingShot shoulder immobilizer/sling. The patient was awakened from anesthesia without difficulty and was transferred to the PACU in stable condition.     COMPLICATIONS: none  DISPOSITION: plan for discharge home after recovery in PACU   POSTOPERATIVE PLAN: Remain in sling (except hygiene and elbow/wrist/hand RoM exercises as instructed by PT) x 6 weeks and NWB for this time. PT to begin 3-4 days after surgery.  Use large rotator cuff repair rehab protocol with subscapularis repair.  ASA 325mg  daily x 2 weeks for DVT ppx.

## 2019-06-30 NOTE — Anesthesia Preprocedure Evaluation (Signed)
Anesthesia Evaluation  Patient identified by MRN, date of birth, ID band Patient awake    Reviewed: Allergy & Precautions, H&P , NPO status , Patient's Chart, lab work & pertinent test results  History of Anesthesia Complications Negative for: history of anesthetic complications  Airway Mallampati: II  TM Distance: >3 FB Neck ROM: full    Dental  (+) Chipped   Pulmonary neg shortness of breath, sleep apnea and Continuous Positive Airway Pressure Ventilation ,           Cardiovascular Exercise Tolerance: Good hypertension, (-) angina(-) Past MI and (-) DOE      Neuro/Psych negative neurological ROS  negative psych ROS   GI/Hepatic negative GI ROS, Neg liver ROS, neg GERD  ,  Endo/Other  negative endocrine ROS  Renal/GU      Musculoskeletal   Abdominal   Peds  Hematology negative hematology ROS (+)   Anesthesia Other Findings Past Medical History: No date: Hypertension  History reviewed. No pertinent surgical history.  BMI    Body Mass Index: 28.00 kg/m      Reproductive/Obstetrics negative OB ROS                             Anesthesia Physical Anesthesia Plan  ASA: III  Anesthesia Plan: General ETT   Post-op Pain Management: GA combined w/ Regional for post-op pain   Induction: Intravenous  PONV Risk Score and Plan: Ondansetron, Dexamethasone, Midazolam and Treatment may vary due to age or medical condition  Airway Management Planned: Oral ETT  Additional Equipment:   Intra-op Plan:   Post-operative Plan: Extubation in OR  Informed Consent: I have reviewed the patients History and Physical, chart, labs and discussed the procedure including the risks, benefits and alternatives for the proposed anesthesia with the patient or authorized representative who has indicated his/her understanding and acceptance.     Dental Advisory Given  Plan Discussed with:  Anesthesiologist, CRNA and Surgeon  Anesthesia Plan Comments: (Patient consented for risks of anesthesia including but not limited to:  - adverse reactions to medications - damage to teeth, lips or other oral mucosa - sore throat or hoarseness - Damage to heart, brain, lungs or loss of life  Patient voiced understanding.)        Anesthesia Quick Evaluation

## 2019-06-30 NOTE — Anesthesia Procedure Notes (Signed)
Procedure Name: Intubation Date/Time: 06/30/2019 7:40 AM Performed by: Gentry Fitz, CRNA Pre-anesthesia Checklist: Patient identified, Emergency Drugs available, Suction available and Patient being monitored Patient Re-evaluated:Patient Re-evaluated prior to induction Oxygen Delivery Method: Circle system utilized Preoxygenation: Pre-oxygenation with 100% oxygen Induction Type: IV induction and Cricoid Pressure applied Laryngoscope Size: Mac and 4 Grade View: Grade I Tube type: Oral Number of attempts: 1 Airway Equipment and Method: Stylet Placement Confirmation: ETT inserted through vocal cords under direct vision,  positive ETCO2,  CO2 detector and breath sounds checked- equal and bilateral Secured at: 24 cm Tube secured with: Tape Dental Injury: Teeth and Oropharynx as per pre-operative assessment

## 2019-06-30 NOTE — Transfer of Care (Signed)
Immediate Anesthesia Transfer of Care Note  Patient: Marvin Cox  Procedure(s) Performed: SHOULDER ARTHROSCOPY WITH SUBACROMIAL DECOMPRESSION,MINI OPEN ROTATOR CUFF REPAIR AND BICEP TENDON REPAIR, SUBCAPULARIS REPAIR (Right Shoulder)  Patient Location: PACU  Anesthesia Type:General  Level of Consciousness: drowsy  Airway & Oxygen Therapy: Patient Spontanous Breathing and Patient connected to face mask oxygen  Post-op Assessment: Report given to RN and Post -op Vital signs reviewed and stable  Post vital signs: Reviewed and stable  Last Vitals:  Vitals Value Taken Time  BP 109/66 06/30/19 1113  Temp    Pulse 73 06/30/19 1115  Resp 20 06/30/19 1115  SpO2 100 % 06/30/19 1115  Vitals shown include unvalidated device data.  Last Pain:  Vitals:   06/30/19 0615  TempSrc: Tympanic  PainSc: 0-No pain         Complications: No apparent anesthesia complications

## 2019-06-30 NOTE — Anesthesia Postprocedure Evaluation (Signed)
Anesthesia Post Note  Patient: Marvin Cox  Procedure(s) Performed: SHOULDER ARTHROSCOPY WITH SUBACROMIAL DECOMPRESSION,MINI OPEN ROTATOR CUFF REPAIR AND BICEP TENDON REPAIR, SUBCAPULARIS REPAIR (Right Shoulder)  Patient location during evaluation: PACU Anesthesia Type: General Level of consciousness: awake and alert Pain management: pain level controlled Vital Signs Assessment: post-procedure vital signs reviewed and stable Respiratory status: spontaneous breathing, nonlabored ventilation, respiratory function stable and patient connected to nasal cannula oxygen Cardiovascular status: blood pressure returned to baseline and stable Postop Assessment: no apparent nausea or vomiting Anesthetic complications: no     Last Vitals:  Vitals:   06/30/19 1214 06/30/19 1219  BP: 116/79 118/80  Pulse: 70 73  Resp: 19 16  Temp:  (!) 36.1 C  SpO2: 100% 100%    Last Pain:  Vitals:   06/30/19 1219  TempSrc: Temporal  PainSc: 0-No pain                 [name]

## 2019-06-30 NOTE — Progress Notes (Signed)
Phenergan 6.25 given for nausea and vomiting   approx 100  Light green

## 2019-06-30 NOTE — Anesthesia Procedure Notes (Signed)
Anesthesia Regional Block: Interscalene brachial plexus block   Pre-Anesthetic Checklist: ,, timeout performed, Correct Patient, Correct Site, Correct Laterality, Correct Procedure, Correct Position, site marked, Risks and benefits discussed,  Surgical consent,  Pre-op evaluation,  At surgeon's request and post-op pain management  Laterality: Upper and Right  Prep: chloraprep       Needles:  Injection technique: Single-shot  Needle Type: Stimiplex     Needle Length: 5cm  Needle Gauge: 22     Additional Needles:   Procedures:,,,, ultrasound used (permanent image in chart),,,,  Narrative:  Start time: 06/30/2019 7:15 AM End time: 06/30/2019 7:19 AM Injection made incrementally with aspirations every 5 mL.  Performed by: Personally  Anesthesiologist: Kamila Broda, Precious Haws, MD  Additional Notes: Patient consented for risk and benefits of nerve block including but not limited to nerve damage, failed block, bleeding and infection.  Patient voiced understanding.  Functioning IV was confirmed and monitors were applied.  A 45mm 22ga Stimuplex needle was used. Sterile prep,hand hygiene and sterile gloves were used.  Minimal sedation used for procedure.  No paresthesia endorsed by patient during the procedure.  Negative aspiration and negative test dose prior to incremental administration of local anesthetic. The patient tolerated the procedure well with no immediate complications.

## 2019-06-30 NOTE — Anesthesia Post-op Follow-up Note (Signed)
Anesthesia QCDR form completed.        

## 2019-06-30 NOTE — Progress Notes (Signed)
Nausea better.

## 2019-06-30 NOTE — H&P (Signed)
Paper H&P to be scanned into permanent record. H&P reviewed. No significant changes noted.  

## 2019-07-01 ENCOUNTER — Encounter: Payer: Self-pay | Admitting: Orthopedic Surgery

## 2019-07-04 ENCOUNTER — Encounter: Payer: Self-pay | Admitting: Orthopedic Surgery

## 2020-11-24 ENCOUNTER — Other Ambulatory Visit: Payer: Self-pay | Admitting: Orthopedic Surgery

## 2020-11-24 DIAGNOSIS — M25512 Pain in left shoulder: Secondary | ICD-10-CM

## 2020-11-24 DIAGNOSIS — G8929 Other chronic pain: Secondary | ICD-10-CM

## 2020-12-02 ENCOUNTER — Ambulatory Visit
Admission: RE | Admit: 2020-12-02 | Discharge: 2020-12-02 | Disposition: A | Discharge status: Home / Self Care | Payer: Managed Care, Other (non HMO) | Source: Ambulatory Visit | Attending: Orthopedic Surgery | Admitting: Orthopedic Surgery

## 2020-12-02 ENCOUNTER — Other Ambulatory Visit: Payer: Self-pay

## 2020-12-02 DIAGNOSIS — G8929 Other chronic pain: Secondary | ICD-10-CM | POA: Diagnosis present

## 2020-12-02 DIAGNOSIS — M25512 Pain in left shoulder: Secondary | ICD-10-CM | POA: Diagnosis present

## 2020-12-29 ENCOUNTER — Other Ambulatory Visit: Payer: Self-pay | Admitting: Orthopedic Surgery

## 2021-01-11 IMAGING — MR MRI OF THE RIGHT SHOULDER WITHOUT CONTRAST
4 of 5 series · 31 of 40 positions shown · non-contrast
Comparison: None.

CLINICAL DATA: Status post fall skiing. Injured in [REDACTED].
Painful range of motion.

EXAM:
MRI OF THE RIGHT SHOULDER WITHOUT CONTRAST
TECHNIQUE: Multiplanar, multisequence MR imaging of the shoulder was performed.
No intravenous contrast was administered.

[Series 5: PD fat-sat · axial · right · 4.0mm · 0.55mm/px · z∈[-39,+90]mm · 8 of 28 slices shown (1 of 2)]
[im 1/28]
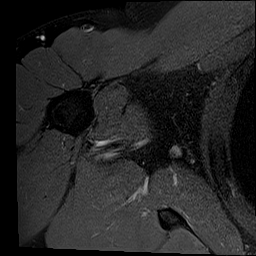
[im 4/28]
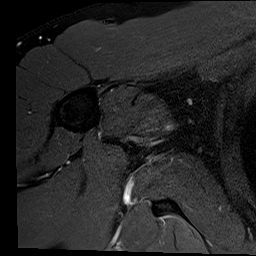
[im 10/28]
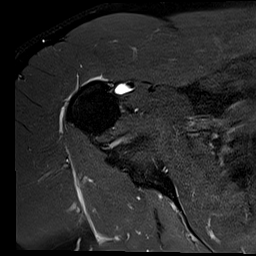
[im 13/28]
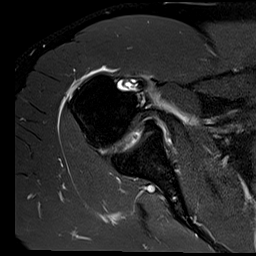
[im 16/28]
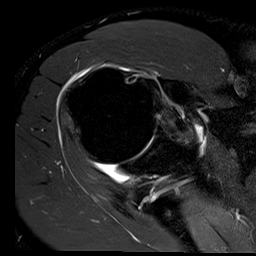
[im 19/28]
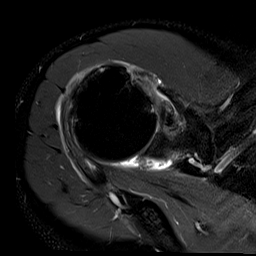
[im 25/28]
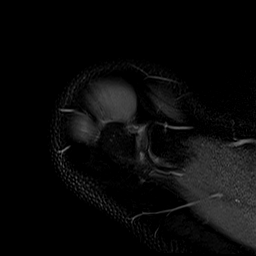
[im 28/28]
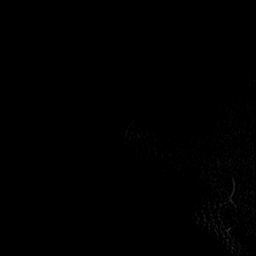

[Series 6: PD fat-sat · oblique · right · 4.0mm · 0.44mm/px · 8 of 26 slices shown (2 of 2)]
[im 1/26]
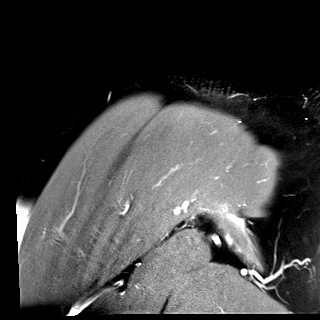
[im 4/26]
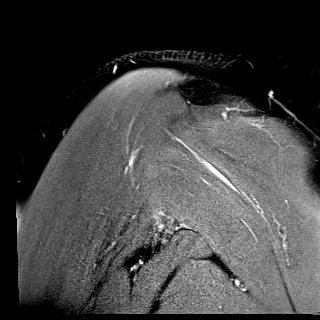
[im 8/26]
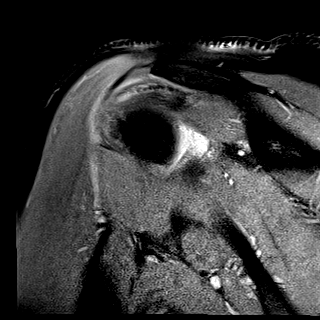
[im 11/26]
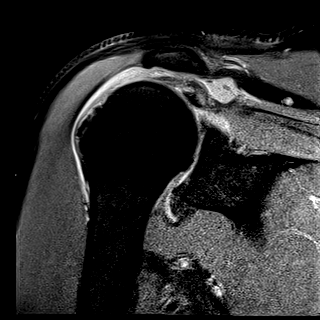
[im 15/26]
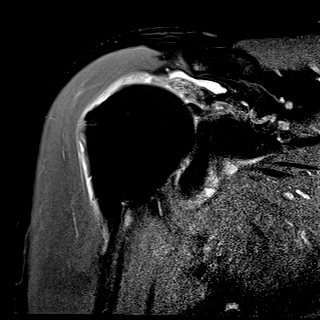
[im 18/26]
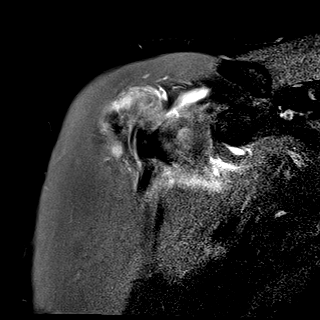
[im 22/26]
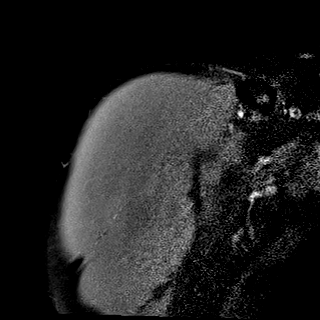
[im 26/26]
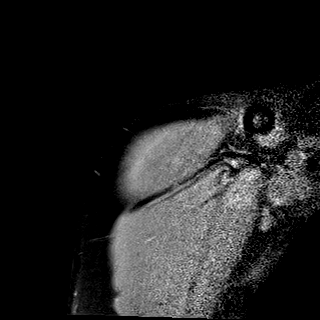

[Series 7: T2 fat-sat · oblique · right · 4.0mm · 0.44mm/px · 8 of 26 slices shown (1 of 2)]
[im 1/26]
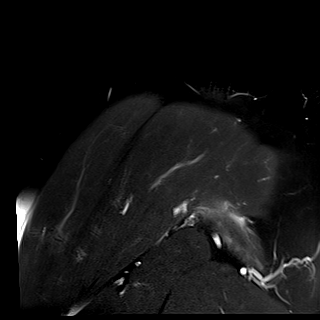
[im 4/26]
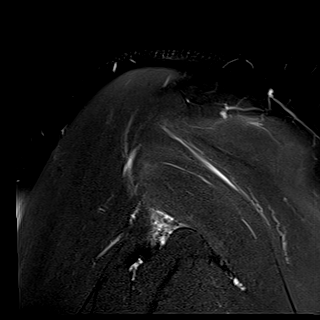
[im 8/26]
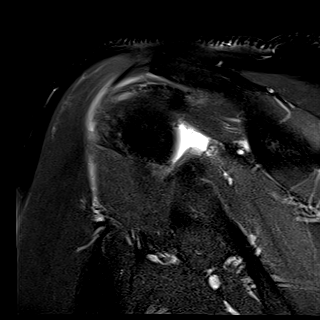
[im 11/26]
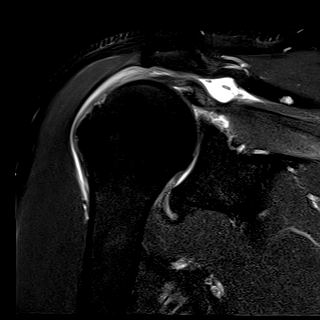
[im 15/26]
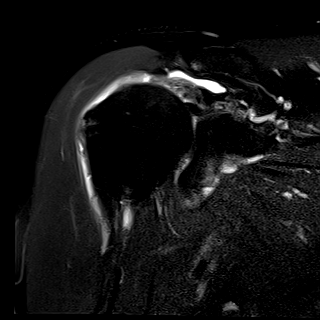
[im 18/26]
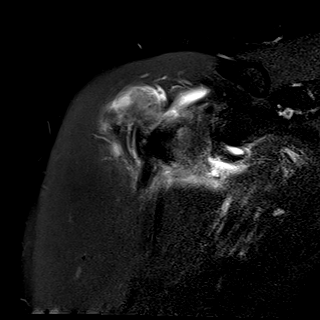
[im 22/26]
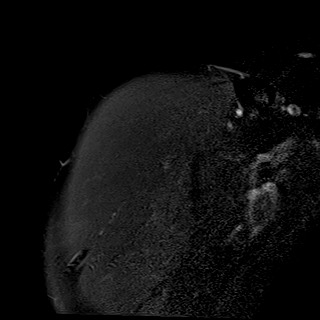
[im 26/26]
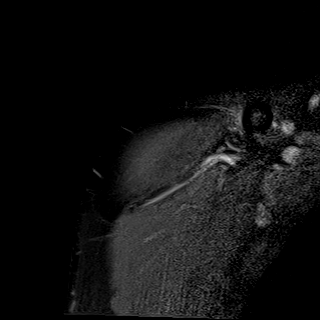

[Series 9: T2 fat-sat · oblique · right · 4.0mm · 0.23mm/px · 7 of 22 slices shown (2 of 2)]
[im 1/22]
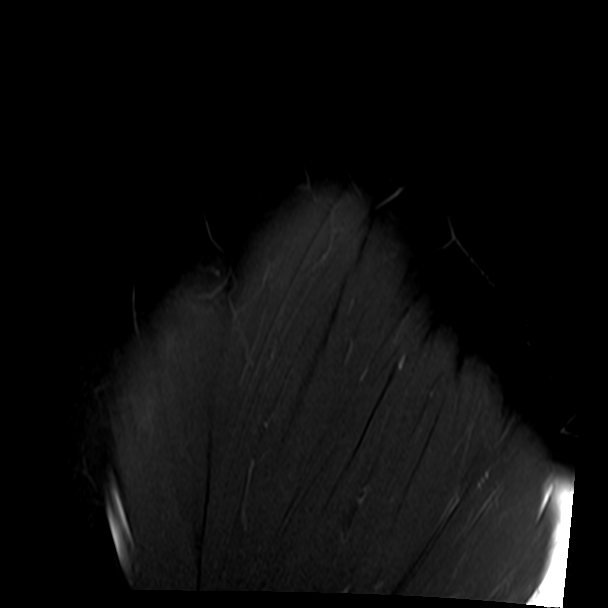
[im 4/22]
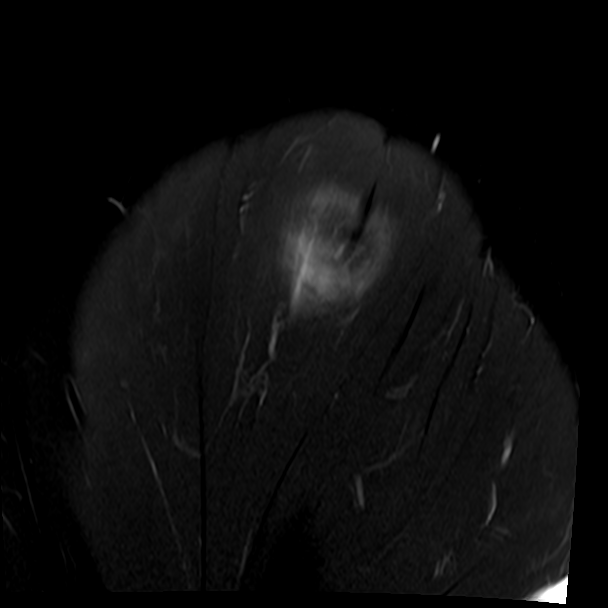
[im 8/22]
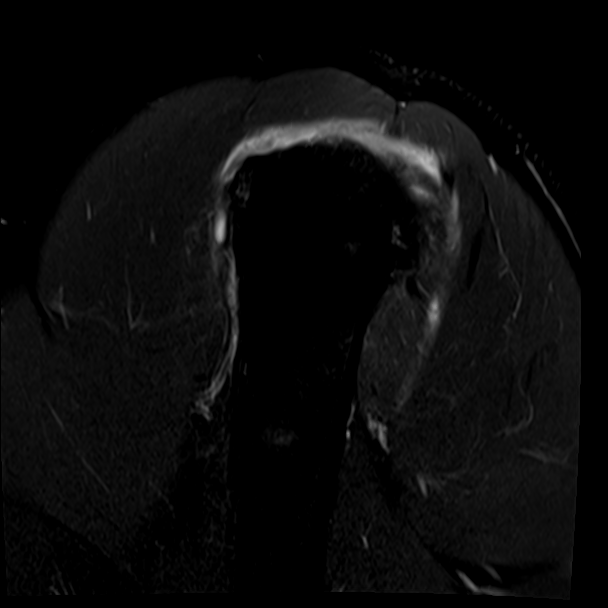
[im 11/22]
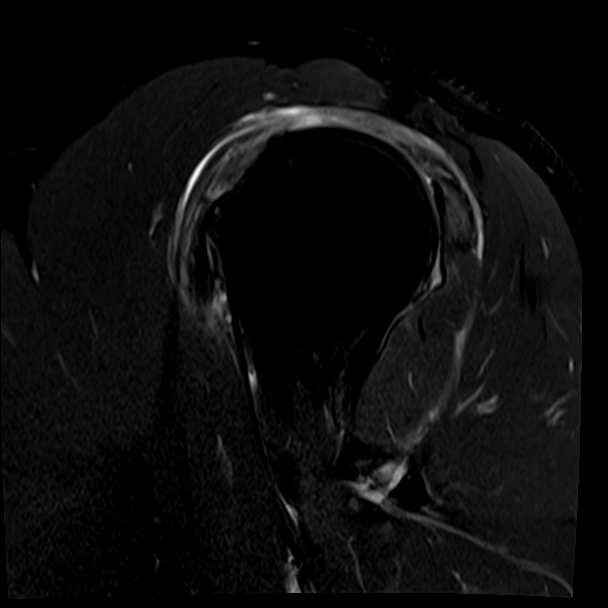
[im 15/22]
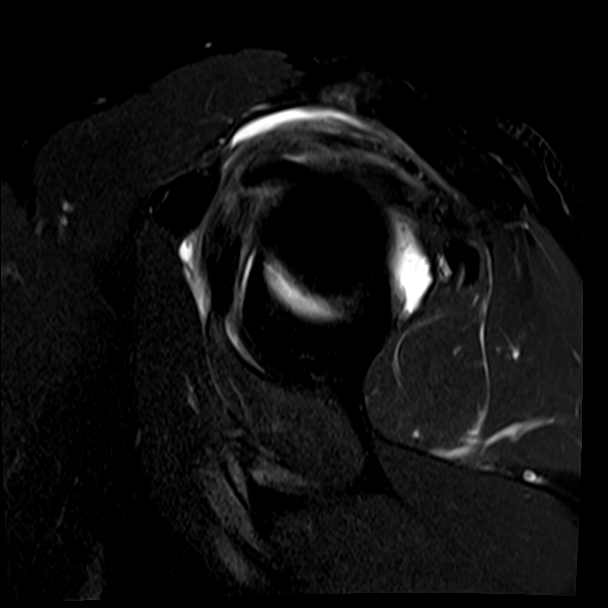
[im 18/22]
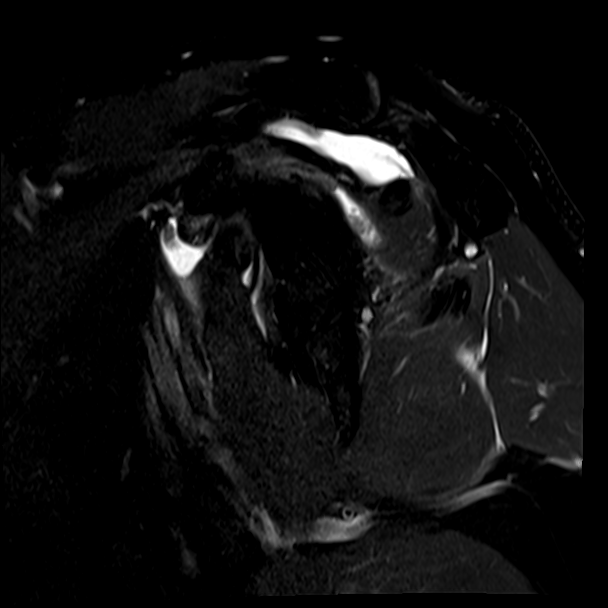
[im 22/22]
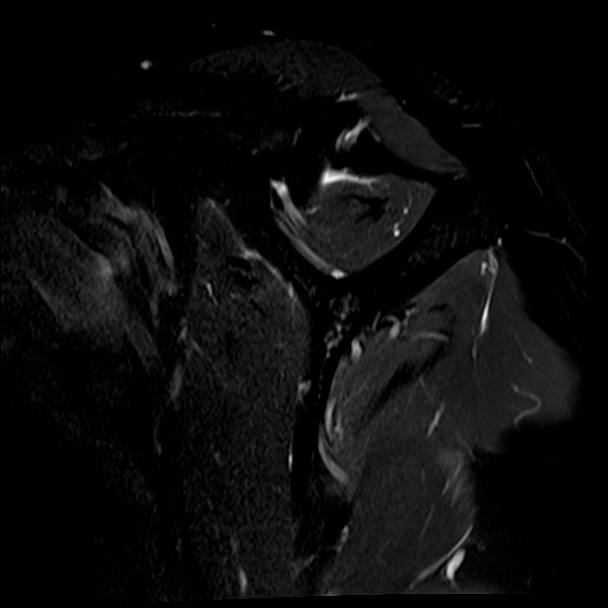

[31 of 40 positions shown; findings below may reference images not displayed]

FINDINGS: Rotator cuff: Complete tear of the supraspinatus tendon with 3.6 cm
of retraction. Moderate tendinosis of the infraspinatus tendon with
a small full-thickness tear of the anterior fibers. Teres minor
tendon is intact. Severe tendinosis of the subscapularis tendon with
a partial-thickness tear.

Muscles: No atrophy or fatty replacement of nor abnormal signal
within, the muscles of the rotator cuff.

Biceps long head: Mild tendinosis of the intra-articular portion of
the long head of the biceps tendon. Partial-thickness tear of the
proximal extra-articular portion of the long head of the biceps
tendon.

Acromioclavicular Joint: Mild arthropathy of the acromioclavicular
joint. Type I acromion. Moderate amount of subacromial/subdeltoid
bursal fluid.

Glenohumeral Joint: No joint effusion.  No chondral defect.

Labrum: Grossly intact, but evaluation is limited by lack of
intraarticular fluid.

Bones:  No marrow abnormality, fracture or dislocation.

Other: No fluid collection or hematoma.
IMPRESSION: 1. Complete tear of the supraspinatus tendon with 3.6 cm of
retraction.
2. Moderate tendinosis of the infraspinatus tendon with a small
full-thickness tear of the anterior fibers.
3. Severe tendinosis of the subscapularis tendon with a
partial-thickness tear.
4. Mild tendinosis of the intra-articular portion of the long head
of the biceps tendon. Partial-thickness tear of the proximal
extra-articular portion of the long head of the biceps tendon.
5. Moderate subacromial/subdeltoid bursitis.

## 2021-01-13 ENCOUNTER — Encounter: Payer: Self-pay | Admitting: Anesthesiology

## 2021-01-13 ENCOUNTER — Other Ambulatory Visit: Payer: Self-pay

## 2021-01-13 ENCOUNTER — Encounter: Payer: Self-pay | Admitting: Orthopedic Surgery

## 2021-01-19 ENCOUNTER — Other Ambulatory Visit: Payer: Self-pay

## 2021-01-19 ENCOUNTER — Other Ambulatory Visit
Admission: RE | Admit: 2021-01-19 | Discharge: 2021-01-19 | Disposition: A | Discharge status: Home / Self Care | Payer: Managed Care, Other (non HMO) | Source: Ambulatory Visit | Attending: Orthopedic Surgery | Admitting: Orthopedic Surgery

## 2021-01-19 DIAGNOSIS — Z01812 Encounter for preprocedural laboratory examination: Secondary | ICD-10-CM | POA: Diagnosis not present

## 2021-01-19 DIAGNOSIS — U071 COVID-19: Secondary | ICD-10-CM | POA: Diagnosis not present

## 2021-01-19 LAB — SARS CORONAVIRUS 2 (TAT 6-24 HRS): SARS Coronavirus 2: POSITIVE — AB

## 2021-01-27 ENCOUNTER — Encounter
Admission: RE | Admit: 2021-01-27 | Discharge: 2021-01-27 | Disposition: A | Discharge status: Home / Self Care | Payer: Managed Care, Other (non HMO) | Source: Ambulatory Visit | Attending: Orthopedic Surgery | Admitting: Orthopedic Surgery

## 2021-01-27 DIAGNOSIS — Z01818 Encounter for other preprocedural examination: Secondary | ICD-10-CM | POA: Diagnosis not present

## 2021-01-27 NOTE — Patient Instructions (Addendum)
Your procedure is scheduled on: 01/31/21- MONDAY Report to the Registration Desk on the 1st floor of the [facility]. To find out your arrival time, please call [phone] between 1PM - 3PM on: 01/28/21- FRIDAY  REMEMBER: Instructions that are not followed completely may result in serious medical risk, up to and including death; or upon the discretion of your surgeon and anesthesiologist your surgery may need to be rescheduled.  Do not eat food after midnight the night before surgery.  No gum chewing, lozengers or hard candies.  You may however, drink CLEAR liquids up to 2 hours before you are scheduled to arrive for your surgery. Do not drink anything within 2 hours of your scheduled arrival time.  Clear liquids include: - water  - apple juice without pulp - gatorade (not RED, PURPLE, OR BLUE) - black coffee or tea (Do NOT add milk or creamers to the coffee or tea) Do NOT drink anything that is not on this list.  In addition, your doctor has ordered for you to drink the provided  Ensure Pre-Surgery Clear Carbohydrate Drink  Drinking this carbohydrate drink up to two hours before surgery helps to reduce insulin resistance and improve patient outcomes. Please complete drinking 2 hours prior to scheduled arrival time.  TAKE THESE MEDICATIONS THE MORNING OF SURGERY WITH A SIP OF WATER: NONE  One week prior to surgery: Stop Anti-inflammatories (NSAIDS) such as Advil, Aleve, Ibuprofen, Motrin, Naproxen, Naprosyn and Aspirin based products such as Excedrin, Goodys Powder, BC Powder.  Stop ON 01/27/21, ANY OVER THE COUNTER supplements until after surgery-Omega-3 Fatty Acids (FISH OIL OMEGA-3 PO) (However, you may continue taking Vitamin D, Vitamin B, and multivitamin up until the day before surgery.)  No Alcohol for 24 hours before or after surgery.  No Smoking including e-cigarettes for 24 hours prior to surgery.  No chewable tobacco products for at least 6 hours prior to surgery.   No nicotine patches on the day of surgery.  Do not use any "recreational" drugs for at least a week prior to your surgery.  Please be advised that the combination of cocaine and anesthesia may have negative outcomes, up to and including death. If you test positive for cocaine, your surgery will be cancelled.  On the morning of surgery brush your teeth with toothpaste and water, you may rinse your mouth with mouthwash if you wish. Do not swallow any toothpaste or mouthwash.  Do not wear jewelry, make-up, hairpins, clips or nail polish.  Do not wear lotions, powders, or perfumes.   Do not shave body from the neck down 48 hours prior to surgery just in case you cut yourself which could leave a site for infection.  Also, freshly shaved skin may become irritated if using the CHG soap.  Contact lenses, hearing aids and dentures may not be worn into surgery.  Do not bring valuables to the hospital. [facility] is not responsible for any missing/lost belongings or valuables.   Use CHG Soap or wipes as directed on instruction sheet.  Bring your C-PAP to the hospital with you in case you may have to spend the night.   Notify your doctor if there is any change in your medical condition (cold, fever, infection).  Wear comfortable clothing (specific to your surgery type) to the hospital.  Plan for stool softeners for home use; pain medications have a tendency to cause constipation. You can also help prevent constipation by eating foods high in fiber such as fruits and vegetables and  drinking plenty of fluids as your diet allows.  After surgery, you can help prevent lung complications by doing breathing exercises.  Take deep breaths and cough every 1-2 hours. Your doctor may order a device called an Incentive Spirometer to help you take deep breaths. When coughing or sneezing, hold a pillow firmly against your incision with both hands. This is called "splinting." Doing this helps protect your  incision. It also decreases belly discomfort.  If you are being admitted to the hospital overnight, leave your suitcase in the car. After surgery it may be brought to your room.  If you are being discharged the day of surgery, you will not be allowed to drive home. You will need a responsible adult (18 years or older) to drive you home and stay with you that night.   If you are taking public transportation, you will need to have a responsible adult (18 years or older) with you. Please confirm with your physician that it is acceptable to use public transportation.   Please call the Pre-admissions Testing Dept. at [phone] if you have any questions about these instructions.  Visitation Policy:  Patients undergoing a surgery or procedure may have one family member or support person with them as long as that person is not COVID-19 positive or experiencing its symptoms.  That person may remain in the waiting area during the procedure.  Inpatient Visitation:    Visiting hours are 7 a.m. to 8 p.m. Patients will be allowed one visitor. The visitor may change daily. The visitor must pass COVID-19 screenings, use hand sanitizer when entering and exiting the patient's room and wear a mask at all times, including in the patient's room. Patients must also wear a mask when staff or their visitor are in the room. Masking is required regardless of vaccination status. Systemwide, no visitors 17 or younger.

## 2021-01-28 ENCOUNTER — Other Ambulatory Visit: Payer: Self-pay

## 2021-01-28 ENCOUNTER — Encounter
Admission: RE | Admit: 2021-01-28 | Discharge: 2021-01-28 | Disposition: A | Discharge status: Home / Self Care | Payer: Managed Care, Other (non HMO) | Source: Ambulatory Visit | Attending: Orthopedic Surgery | Admitting: Orthopedic Surgery

## 2021-01-28 DIAGNOSIS — I10 Essential (primary) hypertension: Secondary | ICD-10-CM | POA: Insufficient documentation

## 2021-01-28 DIAGNOSIS — Z01818 Encounter for other preprocedural examination: Secondary | ICD-10-CM | POA: Insufficient documentation

## 2021-01-28 LAB — CBC
HCT: 40.4 % (ref 39.0–52.0)
Hemoglobin: 13.6 g/dL (ref 13.0–17.0)
MCH: 28.1 pg (ref 26.0–34.0)
MCHC: 33.7 g/dL (ref 30.0–36.0)
MCV: 83.5 fL (ref 80.0–100.0)
Platelets: 281 10*3/uL (ref 150–400)
RBC: 4.84 MIL/uL (ref 4.22–5.81)
RDW: 13.4 % (ref 11.5–15.5)
WBC: 5.2 10*3/uL (ref 4.0–10.5)
nRBC: 0 % (ref 0.0–0.2)

## 2021-01-28 NOTE — Pre-Procedure Instructions (Addendum)
PATIENT WAS NOT GIVEN HIS BAG CONTAINING SOAP AND INSTRUCTIONS, PATIENT REQUEST THAT THIS WRITER CALL HIM TO GO OVER INSTRUCTIONS. THIS WRITER CALLED PATIENT WENT OVER INSTRUCTIONS,INSTRUCTED HIM TO USE AN OVER THE COUNTER ANTI BACTERIAL SOAP BEFORE SURGERY AND ANSWERED ALL QUESTIONS TO HIS SATISFACTION.

## 2021-01-30 MED ORDER — FAMOTIDINE 20 MG PO TABS: 20.0000 mg | ORAL_TABLET | Freq: Once | ORAL | Status: AC

## 2021-01-30 MED ORDER — ORAL CARE MOUTH RINSE: 15.0000 mL | Freq: Once | OROMUCOSAL | Status: AC

## 2021-01-30 MED ORDER — LACTATED RINGERS IV SOLN: INTRAVENOUS | Status: DC

## 2021-01-30 MED ORDER — CEFAZOLIN SODIUM-DEXTROSE 2-4 GM/100ML-% IV SOLN
2.0000 g | INTRAVENOUS | Status: AC
  Administered 2021-01-31: 3 g via INTRAVENOUS

## 2021-01-30 MED ORDER — CHLORHEXIDINE GLUCONATE 0.12 % MT SOLN: 15.0000 mL | Freq: Once | OROMUCOSAL | Status: AC

## 2021-01-31 ENCOUNTER — Encounter: Payer: Self-pay | Admitting: Orthopedic Surgery

## 2021-01-31 ENCOUNTER — Ambulatory Visit: Payer: Self-pay

## 2021-01-31 ENCOUNTER — Other Ambulatory Visit: Payer: Self-pay

## 2021-01-31 ENCOUNTER — Encounter: Payer: Self-pay | Admitting: Certified Registered Nurse Anesthetist

## 2021-01-31 ENCOUNTER — Other Ambulatory Visit: Payer: Managed Care, Other (non HMO)

## 2021-01-31 ENCOUNTER — Ambulatory Visit
Admission: RE | Admit: 2021-01-31 | Discharge: 2021-01-31 | Disposition: A | Discharge status: Home / Self Care | Payer: Managed Care, Other (non HMO) | Attending: Orthopedic Surgery | Admitting: Orthopedic Surgery

## 2021-01-31 ENCOUNTER — Encounter
Admission: RE | Disposition: A | Discharge status: Home / Self Care | Payer: Self-pay | Source: Home / Self Care | Attending: Orthopedic Surgery

## 2021-01-31 DIAGNOSIS — S46012A Strain of muscle(s) and tendon(s) of the rotator cuff of left shoulder, initial encounter: Secondary | ICD-10-CM | POA: Diagnosis present

## 2021-01-31 DIAGNOSIS — F17210 Nicotine dependence, cigarettes, uncomplicated: Secondary | ICD-10-CM | POA: Diagnosis not present

## 2021-01-31 DIAGNOSIS — M7522 Bicipital tendinitis, left shoulder: Secondary | ICD-10-CM | POA: Diagnosis not present

## 2021-01-31 DIAGNOSIS — Z79899 Other long term (current) drug therapy: Secondary | ICD-10-CM | POA: Diagnosis not present

## 2021-01-31 DIAGNOSIS — M7552 Bursitis of left shoulder: Secondary | ICD-10-CM | POA: Insufficient documentation

## 2021-01-31 DIAGNOSIS — Z419 Encounter for procedure for purposes other than remedying health state, unspecified: Secondary | ICD-10-CM

## 2021-01-31 HISTORY — PX: SHOULDER ARTHROSCOPY WITH ROTATOR CUFF REPAIR AND SUBACROMIAL DECOMPRESSION: SHX5686

## 2021-01-31 HISTORY — DX: Sleep apnea, unspecified: G47.30

## 2021-01-31 SURGERY — SHOULDER ARTHROSCOPY WITH ROTATOR CUFF REPAIR AND SUBACROMIAL DECOMPRESSION
Anesthesia: Regional | Laterality: Left

## 2021-01-31 MED ORDER — PHENYLEPHRINE HCL (PRESSORS) 10 MG/ML IV SOLN
INTRAVENOUS | Status: AC
  Filled 2021-01-31: qty 1

## 2021-01-31 MED ORDER — BUPIVACAINE LIPOSOME 1.3 % IJ SUSP
INTRAMUSCULAR | Status: DC | PRN
  Administered 2021-01-31 (×4): 5 mL

## 2021-01-31 MED ORDER — CHLORHEXIDINE GLUCONATE 0.12 % MT SOLN
OROMUCOSAL | Status: AC
  Administered 2021-01-31: 15 mL via OROMUCOSAL
  Filled 2021-01-31: qty 15

## 2021-01-31 MED ORDER — BUPIVACAINE LIPOSOME 1.3 % IJ SUSP
INTRAMUSCULAR | Status: AC
  Filled 2021-01-31: qty 20

## 2021-01-31 MED ORDER — FENTANYL CITRATE (PF) 100 MCG/2ML IJ SOLN
50.0000 ug | INTRAMUSCULAR | Status: AC | PRN
  Administered 2021-01-31 (×2): 50 ug via INTRAVENOUS
  Administered 2021-01-31: 100 ug via INTRAVENOUS

## 2021-01-31 MED ORDER — LIDOCAINE HCL (CARDIAC) PF 100 MG/5ML IV SOSY
PREFILLED_SYRINGE | INTRAVENOUS | Status: DC | PRN
  Administered 2021-01-31: 100 mg via INTRAVENOUS

## 2021-01-31 MED ORDER — OXYCODONE HCL 5 MG PO TABS: 5.0000 mg | ORAL_TABLET | ORAL | 0 refills | Status: DC | PRN

## 2021-01-31 MED ORDER — MIDAZOLAM HCL 2 MG/2ML IJ SOLN
INTRAMUSCULAR | Status: AC
  Administered 2021-01-31: 0.5 mg via INTRAVENOUS
  Filled 2021-01-31: qty 2

## 2021-01-31 MED ORDER — ASPIRIN EC 325 MG PO TBEC: 325.0000 mg | DELAYED_RELEASE_TABLET | Freq: Every day | ORAL | 0 refills | Status: AC

## 2021-01-31 MED ORDER — ONDANSETRON 4 MG PO TBDP: 4.0000 mg | ORAL_TABLET | Freq: Three times a day (TID) | ORAL | 0 refills | Status: DC | PRN

## 2021-01-31 MED ORDER — DEXMEDETOMIDINE (PRECEDEX) IN NS 20 MCG/5ML (4 MCG/ML) IV SYRINGE
PREFILLED_SYRINGE | INTRAVENOUS | Status: AC
  Filled 2021-01-31: qty 5

## 2021-01-31 MED ORDER — FENTANYL CITRATE (PF) 100 MCG/2ML IJ SOLN
INTRAMUSCULAR | Status: AC
  Filled 2021-01-31: qty 2

## 2021-01-31 MED ORDER — CEFAZOLIN SODIUM-DEXTROSE 2-4 GM/100ML-% IV SOLN
INTRAVENOUS | Status: AC
  Filled 2021-01-31: qty 100

## 2021-01-31 MED ORDER — PHENYLEPHRINE HCL (PRESSORS) 10 MG/ML IV SOLN
INTRAVENOUS | Status: DC | PRN
  Administered 2021-01-31: 50 ug via INTRAVENOUS
  Administered 2021-01-31 (×2): 100 ug via INTRAVENOUS
  Administered 2021-01-31: 50 ug via INTRAVENOUS
  Administered 2021-01-31 (×2): 100 ug via INTRAVENOUS

## 2021-01-31 MED ORDER — ONDANSETRON HCL 4 MG/2ML IJ SOLN
INTRAMUSCULAR | Status: AC
  Administered 2021-01-31: 4 mg via INTRAVENOUS
  Filled 2021-01-31: qty 2

## 2021-01-31 MED ORDER — LIDOCAINE HCL (PF) 1 % IJ SOLN
INTRAMUSCULAR | Status: AC
  Filled 2021-01-31: qty 5

## 2021-01-31 MED ORDER — PROPOFOL 10 MG/ML IV BOLUS
INTRAVENOUS | Status: AC
  Filled 2021-01-31: qty 20

## 2021-01-31 MED ORDER — ACETAMINOPHEN 500 MG PO TABS: 1000.0000 mg | ORAL_TABLET | Freq: Three times a day (TID) | ORAL | 2 refills | Status: DC

## 2021-01-31 MED ORDER — FAMOTIDINE 20 MG PO TABS
ORAL_TABLET | ORAL | Status: AC
  Administered 2021-01-31: 20 mg via ORAL
  Filled 2021-01-31: qty 1

## 2021-01-31 MED ORDER — LACTATED RINGERS IV SOLN
INTRAVENOUS | Status: DC | PRN
  Administered 2021-01-31: 4 mL

## 2021-01-31 MED ORDER — ROCURONIUM BROMIDE 100 MG/10ML IV SOLN
INTRAVENOUS | Status: DC | PRN
  Administered 2021-01-31: 10 mg via INTRAVENOUS
  Administered 2021-01-31: 50 mg via INTRAVENOUS
  Administered 2021-01-31 (×2): 10 mg via INTRAVENOUS

## 2021-01-31 MED ORDER — LIDOCAINE HCL (PF) 1 % IJ SOLN
INTRAMUSCULAR | Status: DC | PRN
  Administered 2021-01-31: 100 mL
  Administered 2021-01-31: 1 mL

## 2021-01-31 MED ORDER — ONDANSETRON HCL 4 MG/2ML IJ SOLN: 4.0000 mg | Freq: Once | INTRAMUSCULAR | Status: AC | PRN

## 2021-01-31 MED ORDER — EPINEPHRINE PF 1 MG/ML IJ SOLN
INTRAMUSCULAR | Status: AC
  Filled 2021-01-31: qty 4

## 2021-01-31 MED ORDER — FENTANYL CITRATE (PF) 100 MCG/2ML IJ SOLN: 25.0000 ug | INTRAMUSCULAR | Status: DC | PRN

## 2021-01-31 MED ORDER — MIDAZOLAM HCL 2 MG/2ML IJ SOLN
INTRAMUSCULAR | Status: DC | PRN
  Administered 2021-01-31 (×2): 1 mg via INTRAVENOUS

## 2021-01-31 MED ORDER — BUPIVACAINE HCL (PF) 0.5 % IJ SOLN
INTRAMUSCULAR | Status: DC | PRN
  Administered 2021-01-31 (×2): 5 mL

## 2021-01-31 MED ORDER — ASPIRIN EC 325 MG PO TBEC: 325.0000 mg | DELAYED_RELEASE_TABLET | Freq: Every day | ORAL | 0 refills | Status: DC

## 2021-01-31 MED ORDER — FENTANYL CITRATE (PF) 100 MCG/2ML IJ SOLN
INTRAMUSCULAR | Status: AC
  Administered 2021-01-31: 50 ug via INTRAVENOUS
  Filled 2021-01-31: qty 2

## 2021-01-31 MED ORDER — BUPIVACAINE HCL (PF) 0.5 % IJ SOLN
INTRAMUSCULAR | Status: AC
  Filled 2021-01-31: qty 10

## 2021-01-31 MED ORDER — OXYCODONE HCL 5 MG PO TABS: 5.0000 mg | ORAL_TABLET | Freq: Once | ORAL | Status: DC | PRN

## 2021-01-31 MED ORDER — ONDANSETRON HCL 4 MG/2ML IJ SOLN
INTRAMUSCULAR | Status: AC
  Filled 2021-01-31: qty 2

## 2021-01-31 MED ORDER — MIDAZOLAM HCL 2 MG/2ML IJ SOLN
0.5000 mg | INTRAMUSCULAR | Status: AC | PRN
Start: 2021-01-31 — End: 2021-01-31
  Administered 2021-01-31: 0.5 mg via INTRAVENOUS

## 2021-01-31 MED ORDER — LIDOCAINE HCL (PF) 2 % IJ SOLN
INTRAMUSCULAR | Status: AC
  Filled 2021-01-31: qty 5

## 2021-01-31 MED ORDER — ACETAMINOPHEN 500 MG PO TABS: 1000.0000 mg | ORAL_TABLET | Freq: Three times a day (TID) | ORAL | 2 refills | Status: AC

## 2021-01-31 MED ORDER — DEXAMETHASONE SODIUM PHOSPHATE 10 MG/ML IJ SOLN
INTRAMUSCULAR | Status: DC | PRN
  Administered 2021-01-31: 10 mg via INTRAVENOUS

## 2021-01-31 MED ORDER — DEXMEDETOMIDINE (PRECEDEX) IN NS 20 MCG/5ML (4 MCG/ML) IV SYRINGE
PREFILLED_SYRINGE | INTRAVENOUS | Status: DC | PRN
  Administered 2021-01-31 (×2): 4 ug via INTRAVENOUS

## 2021-01-31 MED ORDER — SUCCINYLCHOLINE CHLORIDE 200 MG/10ML IV SOSY
PREFILLED_SYRINGE | INTRAVENOUS | Status: AC
  Filled 2021-01-31: qty 10

## 2021-01-31 MED ORDER — PROPOFOL 10 MG/ML IV BOLUS
INTRAVENOUS | Status: DC | PRN
  Administered 2021-01-31: 200 mg via INTRAVENOUS

## 2021-01-31 MED ORDER — SUGAMMADEX SODIUM 200 MG/2ML IV SOLN
INTRAVENOUS | Status: DC | PRN
  Administered 2021-01-31: 200 mg via INTRAVENOUS

## 2021-01-31 MED ORDER — ONDANSETRON HCL 4 MG/2ML IJ SOLN
INTRAMUSCULAR | Status: DC | PRN
Start: 2021-01-31 — End: 2021-01-31
  Administered 2021-01-31: 4 mg via INTRAVENOUS

## 2021-01-31 MED ORDER — SODIUM CHLORIDE 0.9 % IV SOLN
INTRAVENOUS | Status: DC | PRN
  Administered 2021-01-31: 10 ug/min via INTRAVENOUS

## 2021-01-31 MED ORDER — OXYCODONE HCL 5 MG/5ML PO SOLN: 5.0000 mg | Freq: Once | ORAL | Status: DC | PRN

## 2021-01-31 MED ORDER — MIDAZOLAM HCL 2 MG/2ML IJ SOLN
INTRAMUSCULAR | Status: AC
  Filled 2021-01-31: qty 2

## 2021-01-31 SURGICAL SUPPLY — 78 items
ADAPTER IRRIG TUBE 2 SPIKE SOL (ADAPTER) ×4 IMPLANT
ADH SKN CLS APL DERMABOND .7 (GAUZE/BANDAGES/DRESSINGS) ×1
ADPR TBG 2 SPK PMP STRL ASCP (ADAPTER) ×2
ANCH SUT 2 SUTTK 12.7X3 KNTLS (Anchor) ×2 IMPLANT
ANCH SUT 2X2.3 TAPE (Anchor) ×2 IMPLANT
ANCH SUT SHRT 12.5 CANN EYLT (Anchor) ×1 IMPLANT
ANCH SUT SWLK 19.1X4.75 (Anchor) ×1 IMPLANT
ANCHOR 2.3 SP SGL 1.2 XBRAID (Anchor) ×2 IMPLANT
ANCHOR 3.9 PEEK 3 CORKSCREW (Anchor) ×2 IMPLANT
ANCHOR SUT BIO SW 4.75X19.1 (Anchor) ×1 IMPLANT
ANCHOR SUT BIOCOMP LK 2.9X12.5 (Anchor) ×1 IMPLANT
ANCHOR SUTURETAK 3X12.7 BIOC (Anchor) ×2 IMPLANT
APL PRP STRL LF DISP 70% ISPRP (MISCELLANEOUS) ×1
BUR BR 5.5 12 FLUTE (BURR) IMPLANT
BUR RADIUS 4.0X18.5 (BURR) ×2 IMPLANT
CANNULA 5.75X7 CRYSTAL CLEAR (CANNULA) ×1 IMPLANT
CANNULA PART THRD DISP 5.75X7 (CANNULA) ×2 IMPLANT
CANNULA PARTIAL THREAD 2X7 (CANNULA) ×2 IMPLANT
CANNULA TWIST IN 8.25X9CM (CANNULA) IMPLANT
CHLORAPREP W/TINT 26 (MISCELLANEOUS) ×2 IMPLANT
COOLER POLAR GLACIER W/PUMP (MISCELLANEOUS) ×2 IMPLANT
COVER LIGHT HANDLE UNIVERSAL (MISCELLANEOUS) ×2 IMPLANT
COVER WAND RF STERILE (DRAPES) ×2 IMPLANT
DERMABOND ADVANCED (GAUZE/BANDAGES/DRESSINGS) ×1
DERMABOND ADVANCED .7 DNX12 (GAUZE/BANDAGES/DRESSINGS) ×1 IMPLANT
DRAPE IMP U-DRAPE 54X76 (DRAPES) ×4 IMPLANT
DRAPE INCISE IOBAN 66X45 STRL (DRAPES) ×3 IMPLANT
DRAPE SHEET LG 3/4 BI-LAMINATE (DRAPES) ×2 IMPLANT
DRAPE U-SHAPE 48X52 POLY STRL (PACKS) ×2 IMPLANT
DRSG TEGADERM 4X4.75 (GAUZE/BANDAGES/DRESSINGS) ×6 IMPLANT
ELECT REM PT RETURN 9FT ADLT (ELECTROSURGICAL) ×2
ELECTRODE REM PT RTRN 9FT ADLT (ELECTROSURGICAL) ×1 IMPLANT
GAUZE SPONGE 2X2 8PLY NS LF (MISCELLANEOUS) ×6 IMPLANT
GAUZE SPONGE 2X2 NS 8PLY (MISCELLANEOUS) ×6
GAUZE SPONGE 4X4 12PLY STRL (GAUZE/BANDAGES/DRESSINGS) ×3 IMPLANT
GAUZE XEROFORM 1X8 LF (GAUZE/BANDAGES/DRESSINGS) ×1 IMPLANT
GLOVE BIO SURGEON STRL SZ7.5 (GLOVE) ×4 IMPLANT
GLOVE BIO SURGEON STRL SZ8 (GLOVE) ×2 IMPLANT
GLOVE BIOGEL PI IND STRL 8 (GLOVE) ×1 IMPLANT
GLOVE BIOGEL PI INDICATOR 8 (GLOVE) ×1
GOWN STRL REIN 2XL XLG LVL4 (GOWN DISPOSABLE) ×2 IMPLANT
GOWN STRL REUS W/ TWL LRG LVL3 (GOWN DISPOSABLE) ×1 IMPLANT
GOWN STRL REUS W/TWL LRG LVL3 (GOWN DISPOSABLE) ×2
IV LACTATED RINGER IRRG 3000ML (IV SOLUTION) ×20
IV LR IRRIG 3000ML ARTHROMATIC (IV SOLUTION) ×8 IMPLANT
KIT CORKSCREW KNTLS 3.9 S/T/P (INSTRUMENTS) ×1 IMPLANT
KIT INSERTION 2.9 PUSHLOCK (KITS) ×1 IMPLANT
KIT PERC INSERT 3.0 KNTLS (KITS) ×1 IMPLANT
KIT STABILIZATION SHOULDER (MISCELLANEOUS) ×1 IMPLANT
KIT TURNOVER KIT A (KITS) ×2 IMPLANT
MANIFOLD 4PT FOR NEPTUNE1 (MISCELLANEOUS) ×2 IMPLANT
MASK FACE SPIDER DISP (MASK) ×2 IMPLANT
MAT ABSORB  FLUID 56X50 GRAY (MISCELLANEOUS) ×2
MAT ABSORB FLUID 56X50 GRAY (MISCELLANEOUS) ×2 IMPLANT
NDL MAYO CATGUT SZ 2 (NEEDLE) IMPLANT
NEEDLE MAYO CATGUT SZ 1.5 (NEEDLE)
NEEDLE MAYO CATGUT SZ 2 (NEEDLE) IMPLANT
PACK ARTHROSCOPY SHOULDER (MISCELLANEOUS) ×2 IMPLANT
PAD WRAPON POLAR SHDR UNIV (MISCELLANEOUS) ×1 IMPLANT
PASSER SUT SWIFTSTITCH HIP CRT (INSTRUMENTS) ×1 IMPLANT
SET TUBE SUCT SHAVER OUTFL 24K (TUBING) ×2 IMPLANT
SET TUBE TIP INTRA-ARTICULAR (MISCELLANEOUS) ×2 IMPLANT
SUT ETHILON 3-0 FS-10 30 BLK (SUTURE) ×2
SUT MNCRL 4-0 (SUTURE) ×2
SUT MNCRL 4-0 27XMFL (SUTURE) ×1
SUT PDS AB 1 CT1 36 (SUTURE) ×1 IMPLANT
SUT PROLENE 2 0 CT2 30 (SUTURE) IMPLANT
SUT VIC AB 0 CT1 36 (SUTURE) ×2 IMPLANT
SUT VIC AB 2-0 CT2 27 (SUTURE) ×2 IMPLANT
SUT VIC AB 3-0 SH 27 (SUTURE) ×2
SUT VIC AB 3-0 SH 27X BRD (SUTURE) ×1 IMPLANT
SUTURE EHLN 3-0 FS-10 30 BLK (SUTURE) ×1 IMPLANT
SUTURE MNCRL 4-0 27XMF (SUTURE) ×1 IMPLANT
TAPE MICROFOAM 4IN (TAPE) ×1 IMPLANT
TUBING ARTHRO INFLOW-ONLY STRL (TUBING) ×2 IMPLANT
TUBING CONNECTING 10 (TUBING) IMPLANT
WAND WEREWOLF FLOW 90D (MISCELLANEOUS) ×2 IMPLANT
WRAPON POLAR PAD SHDR UNIV (MISCELLANEOUS) ×2

## 2021-01-31 NOTE — Anesthesia Preprocedure Evaluation (Addendum)
Anesthesia Evaluation  Patient identified by MRN, date of birth, ID band Patient awake    Reviewed: Allergy & Precautions, H&P , NPO status , Patient's Chart, lab work & pertinent test results  History of Anesthesia Complications (+) PONV  Airway Mallampati: II  TM Distance: >3 FB Neck ROM: full    Dental  (+) Teeth Intact   Pulmonary sleep apnea and Continuous Positive Airway Pressure Ventilation , neg COPD, Current Smoker,    breath sounds clear to auscultation       Cardiovascular hypertension, (-) angina(-) Past MI and (-) Cardiac Stents negative cardio ROS  (-) dysrhythmias  Rhythm:regular Rate:Normal     Neuro/Psych negative neurological ROS  negative psych ROS   GI/Hepatic negative GI ROS, Neg liver ROS,   Endo/Other  negative endocrine ROS  Renal/GU      Musculoskeletal   Abdominal   Peds  Hematology negative hematology ROS (+)   Anesthesia Other Findings Past Medical History: No date: Hypertension No date: PONV (postoperative nausea and vomiting) No date: Sleep apnea     Comment:  CPAP  Past Surgical History: 06/30/2019: SHOULDER ARTHROSCOPY WITH SUBACROMIAL DECOMPRESSION,  ROTATOR CUFF REPAIR AND BICEP TENDON REPAIR; Right     Comment:  Procedure: SHOULDER ARTHROSCOPY WITH SUBACROMIAL               DECOMPRESSION,MINI OPEN ROTATOR CUFF REPAIR AND BICEP               TENDON REPAIR, SUBCAPULARIS REPAIR;  Surgeon: Signa Kell, MD;  Location: [facility] ORS;  Service: Orthopedics;                Laterality: Right;  BMI    Body Mass Index: 29.55 kg/m      Reproductive/Obstetrics negative OB ROS                            Anesthesia Physical Anesthesia Plan  ASA: II  Anesthesia Plan: General ETT   Post-op Pain Management: GA combined w/ Regional for post-op pain   Induction:   PONV Risk Score and Plan: Ondansetron, Dexamethasone, Midazolam and Treatment may  vary due to age or medical condition  Airway Management Planned:   Additional Equipment:   Intra-op Plan:   Post-operative Plan:   Informed Consent: I have reviewed the patients History and Physical, chart, labs and discussed the procedure including the risks, benefits and alternatives for the proposed anesthesia with the patient or authorized representative who has indicated his/her understanding and acceptance.     Dental Advisory Given  Plan Discussed with: Anesthesiologist, CRNA and Surgeon  Anesthesia Plan Comments:         Anesthesia Quick Evaluation

## 2021-01-31 NOTE — H&P (Signed)
Paper H&P to be scanned into permanent record. H&P reviewed. No significant changes noted.  

## 2021-01-31 NOTE — Transfer of Care (Signed)
Immediate Anesthesia Transfer of Care Note  Patient: Marvin Cox  Procedure(s) Performed: Left shoulder arthroscopic subacromial decompression and biceps tenodesis with possible Regeneten patch application vs rotator cuff repair (Left )  Patient Location: PACU  Anesthesia Type:General and Regional  Level of Consciousness: drowsy  Airway & Oxygen Therapy: Patient Spontanous Breathing and Patient connected to face mask  Post-op Assessment: Report given to RN and Post -op Vital signs reviewed and stable  Post vital signs: Reviewed and stable  Last Vitals:  Vitals Value Taken Time  BP 118/73 01/31/21 1445  Temp    Pulse 76 01/31/21 1446  Resp 17 01/31/21 1446  SpO2 100 % 01/31/21 1446  Vitals shown include unvalidated device data.  Last Pain:  Vitals:   01/31/21 0921  TempSrc: Temporal  PainSc: 0-No pain         Complications: No complications documented.

## 2021-01-31 NOTE — Discharge Instructions (Addendum)
Post-Op Instructions - Rotator Cuff Repair  1. Bracing: You will wear a shoulder immobilizer or sling for 4 weeks.   2. Driving: No driving for 3 weeks post-op. When driving, do not wear the immobilizer. Ideally, we recommend no driving for 4 weeks while sling is in place as one arm will be immobilized.   3. Activity: No active lifting for 2 months. Wrist, hand, and elbow motion only. Avoid lifting the upper arm away from the body except for hygiene. You are permitted to bend and straighten the elbow passively only (no active elbow motion). You may use your hand and wrist for typing, writing, and managing utensils (cutting food). Do not lift more than a coffee cup for 8 weeks.  When sleeping or resting, inclined positions (recliner chair or wedge pillow) and a pillow under the forearm for support may provide better comfort for up to 4 weeks.  Avoid long distance travel for 4 weeks.  Return to normal activities after rotator cuff repair repair normally takes 6 months on average. If rehab goes very well, may be able to do most activities at 4 months, except overhead or contact sports.  4. Physical Therapy: Begins 3-4 days after surgery, and proceed 1 time per week for the first 6 weeks, then 1-2 times per week from weeks 6-20 post-op.  5. Medications:  - You will be provided a prescription for narcotic pain medicine. After surgery, take 1-2 narcotic tablets every 4 hours if needed for severe pain.  - A prescription for anti-nausea medication will be provided in case the narcotic medicine causes nausea - take 1 tablet every 6 hours only if nauseated.   - Take tylenol 1000 mg (2 Extra Strength tablets or 3 regular strength) every 8 hours for pain.  May decrease or stop tylenol 5 days after surgery if you are having minimal pain. - Take ASA '325mg'$ /day x 2 weeks to help prevent DVTs/PEs (blood clots).  - DO NOT take ANY nonsteroidal anti-inflammatory pain medications (Advil, Motrin, Ibuprofen, Aleve,  Naproxen, or Naprosyn). These medicines can inhibit healing of your shoulder repair.    If you are taking prescription medication for anxiety, depression, insomnia, muscle spasm, chronic pain, or for attention deficit disorder, you are advised that you are at a higher risk of adverse effects with use of narcotics post-op, including narcotic addiction/dependence, depressed breathing, death. If you use non-prescribed substances: alcohol, marijuana, cocaine, heroin, methamphetamines, etc., you are at a higher risk of adverse effects with use of narcotics post-op, including narcotic addiction/dependence, depressed breathing, death. You are advised that taking > 50 morphine milligram equivalents (MME) of narcotic pain medication per day results in twice the risk of overdose or death. For your prescription provided: oxycodone 5 mg - taking more than 6 tablets per day would result in > 50 morphine milligram equivalents (MME) of narcotic pain medication. Be advised that we will prescribe narcotics short-term, for acute post-operative pain only - 3 weeks for major operations such as shoulder repair/reconstruction surgeries.     6. Post-Op Appointment:  Your first post-op appointment will be 10-14 days post-op.  7. Work or School: For most, but not all procedures, we advise staying out of work or school for at least 1 to 2 weeks in order to recover from the stress of surgery and to allow time for healing.   If you need a work or school note this can be provided.   8. Smoking: If you are a smoker, you need to refrain from  smoking in the postoperative period. The nicotine in cigarettes will inhibit healing of your shoulder repair and decrease the chance of successful repair. Similarly, nicotine containing products (gum, patches) should be avoided.   Post-operative Brace: Apply and remove the brace you received as you were instructed to at the time of fitting and as described in detail as the brace's  instructions for use indicate.  Wear the brace for the period of time prescribed by your physician.  The brace can be cleaned with soap and water and allowed to air dry only.  Should the brace result in increased pain, decreased feeling (numbness/tingling), increased swelling or an overall worsening of your medical condition, please contact your doctor immediately.  If an emergency situation occurs as a result of wearing the brace after normal business hours, please dial 911 and seek immediate medical attention.  Let your doctor know if you have any further questions about the brace issued to you. Refer to the shoulder sling instructions for use if you have any questions regarding the correct fit of your shoulder sling.  [facility]bles[facility]ooting: [phone]  Video that illustrates how to properly use a shoulder sling: "Instructions for Proper Use of an Orthopaedic Sling" [email or web address]         General Anesthesia, Adult, Care After This sheet gives you information about how to care for yourself after your procedure. Your health care provider may also give you more specific instructions. If you have problems or questions, contact your health care provider. What can I expect after the procedure? After the procedure, the following side effects are common:  Pain or discomfort at the IV site.  Nausea.  Vomiting.  Sore throat.  Trouble concentrating.  Feeling cold or chills.  Feeling weak or tired.  Sleepiness and fatigue.  Soreness and body aches. These side effects can affect parts of the body that were not involved in surgery. Follow these instructions at home: For the time period you were told by your health care provider:  Rest.  Do not participate in activities where you could fall or become injured.  Do not drive or use machinery.  Do not drink alcohol.  Do not take sleeping pills or medicines that cause drowsiness.  Do  not make important decisions or sign legal documents.  Do not take care of children on your own.   Eating and drinking  Follow any instructions from your health care provider about eating or drinking restrictions.  When you feel hungry, start by eating small amounts of foods that are soft and easy to digest (bland), such as toast. Gradually return to your regular diet.  Drink enough fluid to keep your urine pale yellow.  If you vomit, rehydrate by drinking water, juice, or clear broth. General instructions  If you have sleep apnea, surgery and certain medicines can increase your risk for breathing problems. Follow instructions from your health care provider about wearing your sleep device: ? Anytime you are sleeping, including during daytime naps. ? While taking prescription pain medicines, sleeping medicines, or medicines that make you drowsy.  Have a responsible adult stay with you for the time you are told. It is important to have someone help care for you until you are awake and alert.  Return to your normal activities as told by your health care provider. Ask your health care provider what activities are safe for you.  Take over-the-counter and prescription medicines only as told by your health care provider.  If you smoke,  do not smoke without supervision.  Keep all follow-up visits as told by your health care provider. This is important. Contact a health care provider if:  You have nausea or vomiting that does not get better with medicine.  You cannot eat or drink without vomiting.  You have pain that does not get better with medicine.  You are unable to pass urine.  You develop a skin rash.  You have a fever.  You have redness around your IV site that gets worse. Get help right away if:  You have difficulty breathing.  You have chest pain.  You have blood in your urine or stool, or you vomit blood. Summary  After the procedure, it is common to have a sore  throat or nausea. It is also common to feel tired.  Have a responsible adult stay with you for the time you are told. It is important to have someone help care for you until you are awake and alert.  When you feel hungry, start by eating small amounts of foods that are soft and easy to digest (bland), such as toast. Gradually return to your regular diet.  Drink enough fluid to keep your urine pale yellow.  Return to your normal activities as told by your health care provider. Ask your health care provider what activities are safe for you. This information is not intended to replace advice given to you by your health care provider. Make sure you discuss any questions you have with your health care provider. Document Revised: 08/26/2020 Document Reviewed: 03/25/2020 Elsevier Patient Education  2021 [employer]      Interscalene Nerve Block with Exparel   WEAR GREEN BRACELET FOR 3 DAYS  1.  For your surgery you have received an Interscalene Nerve Block with Exparel. 2. Nerve Blocks affect many types of nerves, including nerves that control movement, pain and normal sensation.  You may experience feelings such as numbness, tingling, heaviness, weakness or the inability to move your arm or the feeling or sensation that your arm has "fallen asleep". 3. A nerve block with Exparel can last up to 5 days.  Usually the weakness wears off first.  The tingling and heaviness usually wear off next.  Finally you may start to notice pain.  Keep in mind that this may occur in any order.  Once a nerve block starts to wear off it is usually completely gone within 60 minutes. 4. ISNB may cause mild shortness of breath, a hoarse voice, blurry vision, unequal pupils, or drooping of the face on the same side as the nerve block.  These symptoms will usually resolve with the numbness.  Very rarely the procedure itself can cause mild seizures. 5. If needed, your surgeon will give you a prescription for pain medication.   It will take about 60 minutes for the oral pain medication to become fully effective.  So, it is recommended that you start taking this medication before the nerve block first begins to wear off, or when you first begin to feel discomfort. 6. Take your pain medication only as prescribed.  Pain medication can cause sedation and decrease your breathing if you take more than you need for the level of pain that you have. 7. Nausea is a common side effect of many pain medications.  You may want to eat something before taking your pain medicine to prevent nausea. 8. After an Interscalene nerve block, you cannot feel pain, pressure or extremes in temperature in the effected arm.  Because your arm is numb it is at an increased risk for injury.  To decrease the possibility of injury, please practice the following:  a. While you are awake change the position of your arm frequently to prevent too much pressure on any one area for prolonged periods of time. b.  If you have a cast or tight dressing, check the color or your fingers every couple of hours.  Call your surgeon with the appearance of any discoloration (white or blue). c. If you are given a sling to wear before you go home, please wear it  at all times until the block has completely worn off.  Do not get up at night without your sling. d. Please contact [facility] or your surgeon if you do not begin to regain sensation after 7 days from the surgery.  Anesthesia may be contacted by calling the Same Day Surgery Department, Mon. through Fri., 6 am to 4 pm at [phone].   e. If you experience any other problems or concerns, please contact your surgeon's office. f. If you experience severe or prolonged shortness of breath go to the nearest emergency department.

## 2021-01-31 NOTE — Anesthesia Procedure Notes (Signed)
Procedure Name: MAC Date/Time: 01/31/2021 11:11 AM Performed by: Gentry Fitz, CRNA Pre-anesthesia Checklist: Patient identified, Emergency Drugs available, Suction available and Patient being monitored Patient Re-evaluated:Patient Re-evaluated prior to induction Oxygen Delivery Method: Circle system utilized Preoxygenation: Pre-oxygenation with 100% oxygen Induction Type: IV induction Ventilation: Mask ventilation without difficulty Laryngoscope Size: Mac and 4 Grade View: Grade I Tube type: Oral Tube size: 7.5 mm Number of attempts: 1 Airway Equipment and Method: Stylet Placement Confirmation: ETT inserted through vocal cords under direct vision,  positive ETCO2 and breath sounds checked- equal and bilateral Secured at: 22 cm Tube secured with: Tape Dental Injury: Teeth and Oropharynx as per pre-operative assessment
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
## 2021-02-01 ENCOUNTER — Encounter: Payer: Self-pay | Admitting: Orthopedic Surgery

## 2021-02-01 NOTE — Anesthesia Postprocedure Evaluation (Signed)
Anesthesia Post Note  Patient: Marvin Cox  Procedure(s) Performed: Left shoulder arthroscopic  biceps tenodesis with  rotator cuff repair (Left )  Patient location during evaluation: PACU Anesthesia Type: General Level of consciousness: awake and alert Pain management: pain level controlled Vital Signs Assessment: post-procedure vital signs reviewed and stable Respiratory status: spontaneous breathing, nonlabored ventilation and respiratory function stable Cardiovascular status: blood pressure returned to baseline and stable Postop Assessment: no apparent nausea or vomiting Anesthetic complications: no   No complications documented.   Last Vitals:  Vitals:   01/31/21 1558 01/31/21 1643  BP: 121/85 123/84  Pulse: 73   Resp: 16 16  Temp: (!) 35.8 C 36.5 C  SpO2: 100%     Last Pain:  Vitals:   02/01/21 0853  TempSrc:   PainSc: 1                  Karleen Hampshire

## 2021-02-07 ENCOUNTER — Inpatient Hospital Stay: Payer: Managed Care, Other (non HMO) | Attending: Internal Medicine | Admitting: Internal Medicine

## 2021-02-07 ENCOUNTER — Inpatient Hospital Stay: Payer: Managed Care, Other (non HMO)

## 2021-02-07 ENCOUNTER — Other Ambulatory Visit: Payer: Self-pay

## 2021-02-07 ENCOUNTER — Encounter: Payer: Self-pay | Admitting: Internal Medicine

## 2021-02-07 DIAGNOSIS — Z1509 Genetic susceptibility to other malignant neoplasm: Secondary | ICD-10-CM

## 2021-02-07 DIAGNOSIS — F17200 Nicotine dependence, unspecified, uncomplicated: Secondary | ICD-10-CM | POA: Diagnosis not present

## 2021-02-07 DIAGNOSIS — Z8042 Family history of malignant neoplasm of prostate: Secondary | ICD-10-CM | POA: Diagnosis not present

## 2021-02-07 DIAGNOSIS — Z803 Family history of malignant neoplasm of breast: Secondary | ICD-10-CM

## 2021-02-07 LAB — PSA: Prostatic Specific Antigen: 1.23 ng/mL (ref 0.00–4.00)

## 2021-02-07 NOTE — Assessment & Plan Note (Addendum)
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  I reviewed the importance of BRCA-2 pathogenic mutation abnormality.  Reviewed the increased risk of cancers including breast cancer; prostate cancer; melanomas; pancreatic cancer amongst others.  #I would recommend age-appropriate screening including colon cancer/PSA for prostate cancer.  Recommend dermatology evaluation on annual basis for screening of melanoma.  Will discuss with genetic counseling regarding surveillance of pancreatic cancer/for final recommendations.  #Screening for rest of the family-daughter-x2 [mid 34s]; son 63-year-old.  As per the patient daughters have been informed for consideration of genetic testing.  He is aware that his 23-year-old son does not need to be tested at this time however could consider testing around 54 years of age.  Thank you Dr.Lanthavong for allowing me to participate in the care of your pleasant patient. Please do not hesitate to contact me with questions or concerns in the interim.  # DISPOSITION: # labs today PSA # follow up TBD- Dr.B

## 2021-02-07 NOTE — Progress Notes (Signed)
[place] NOTE  Patient Care Team: Dion Body, MD as PCP - General (Family Medicine)  CHIEF COMPLAINTS/PURPOSE OF CONSULTATION: BRCA-2 positive  # JAN 2022 [screening]- BRCA-2 heterozygous positive  # Sisters x2- positive for BRCA/dad-prostate cancer  Oncology History   No history exists.     HISTORY OF PRESENTING ILLNESS:  Marvin Cox 55 y.o.  male has been referred to [place] for further evaluation recommendations for BRCA2 genetic abnormality.  Patient states that his sister in [place] has been diagnosed with breast cancer-work-up positive for BRCA2 mutation.  Also her other sister who had BRCA mutation on screening.  Patient's father had prostate cancer.   Patient was screened for BRCA mutation-shown to have deleterious mutation.  He has been referred to [place] for further evaluation recommendations.  He states to have received counseling from "company"  Patient does not have any personal history of malignancies.  Patient denies any weight loss.  Denies any nausea vomiting.   Patient recently underwent left shoulder surgery in splint.  Review of Systems  Constitutional: Negative for chills, diaphoresis, fever, malaise/fatigue and weight loss.  HENT: Negative for nosebleeds and sore throat.   Eyes: Negative for double vision.  Respiratory: Negative for cough, hemoptysis, sputum production, shortness of breath and wheezing.   Cardiovascular: Negative for chest pain, palpitations, orthopnea and leg swelling.  Gastrointestinal: Negative for abdominal pain, blood in stool, constipation, diarrhea, heartburn, melena, nausea and vomiting.  Genitourinary: Negative for dysuria, frequency and urgency.  Musculoskeletal: Negative for back pain and joint pain.  Skin: Negative.  Negative for itching and rash.  Neurological: Negative for dizziness, tingling, focal weakness, weakness and headaches.  Endo/Heme/Allergies: Does not bruise/bleed easily.   Psychiatric/Behavioral: Negative for depression. The patient is not nervous/anxious and does not have insomnia.      MEDICAL HISTORY:  Past Medical History:  Diagnosis Date  . Hypertension   . PONV (postoperative nausea and vomiting)   . Sleep apnea    CPAP    SURGICAL HISTORY: Past Surgical History:  Procedure Laterality Date  . SHOULDER ARTHROSCOPY WITH ROTATOR CUFF REPAIR AND SUBACROMIAL DECOMPRESSION Left 01/31/2021   Procedure: Left shoulder arthroscopic  biceps tenodesis with  rotator cuff repair;  Surgeon: Leim Fabry, MD;  Location: [facility] ORS;  Service: Orthopedics;  Laterality: Left;  Sleep apnea  . SHOULDER ARTHROSCOPY WITH SUBACROMIAL DECOMPRESSION, ROTATOR CUFF REPAIR AND BICEP TENDON REPAIR Right 06/30/2019   Procedure: SHOULDER ARTHROSCOPY WITH SUBACROMIAL DECOMPRESSION,MINI OPEN ROTATOR CUFF REPAIR AND BICEP TENDON REPAIR, SUBCAPULARIS REPAIR;  Surgeon: Leim Fabry, MD;  Location: [facility] ORS;  Service: Orthopedics;  Laterality: Right;    SOCIAL HISTORY: Social History   Socioeconomic History  . Marital status: Married    Spouse name: Not on file  . Number of children: Not on file  . Years of education: Not on file  . Highest education level: Not on file  Occupational History  . Not on file  Tobacco Use  . Smoking status: Light Tobacco Smoker  . Smokeless tobacco: Never Used  . Tobacco comment: may have occasional cigarette  Vaping Use  . Vaping Use: Never used  Substance and Sexual Activity  . Alcohol use: Yes    Alcohol/week: 1.0 standard drink    Types: 1 Standard drinks or equivalent per week  . Drug use: Not on file  . Sexual activity: Not on file  Other Topics Concern  . Not on file  Social History Narrative   Lives in [place]; with girlffriend. Children- 24 &  31 y girls. Mechanic.    Social Determinants of Health   Financial Resource Strain: Not on file  Food Insecurity: Not on file  Transportation Needs: Not on file  Physical Activity: Not on  file  Stress: Not on file  Social Connections: Not on file  Intimate Partner Violence: Not on file    FAMILY HISTORY: Family History  Problem Relation Age of Onset  . Prostate cancer Father        in 48s  . Breast cancer Sister   . BRCA 1/2 Sister        screening    ALLERGIES:  has No Known Allergies.  MEDICATIONS:  Current Outpatient Medications  Medication Sig Dispense Refill  . acetaminophen (TYLENOL) 500 MG tablet Take 2 tablets (1,000 mg total) by mouth every 8 (eight) hours. 90 tablet 2  . aspirin EC 325 MG tablet Take 1 tablet (325 mg total) by mouth daily for 14 days. 14 tablet 0  . hydrochlorothiazide (HYDRODIURIL) 12.5 MG tablet Take 12.5 mg by mouth daily.    . Multiple Vitamin (MULTIVITAMIN WITH MINERALS) TABS tablet Take 1 tablet by mouth daily.    . Omega-3 Fatty Acids (FISH OIL OMEGA-3 PO) Take 1 capsule by mouth daily.    . sildenafil (VIAGRA) 50 MG tablet Take 25 mg by mouth daily as needed for erectile dysfunction.    Marland Kitchen telmisartan (MICARDIS) 80 MG tablet Take 80 mg by mouth daily.    . ondansetron (ZOFRAN ODT) 4 MG disintegrating tablet Take 1 tablet (4 mg total) by mouth every 8 (eight) hours as needed for nausea or vomiting. (Patient not taking: Reported on 02/07/2021) 20 tablet 0   No current facility-administered medications for this visit.      Marland Kitchen  PHYSICAL EXAMINATION: ECOG PERFORMANCE STATUS: 0 - Asymptomatic  Vitals:   02/07/21 1119  BP: 121/75  Pulse: 72  Resp: 20  Temp: 97.6 F (36.4 C)   Filed Weights   02/07/21 1119  Weight: 229 lb (103.9 kg)    Physical Exam Constitutional:      Comments: Ambulating independently.  Alone.  Left shoulder in a splint.  HENT:     Head: Normocephalic and atraumatic.     Mouth/Throat:     Mouth: Oropharynx is clear and moist.     Pharynx: No oropharyngeal exudate.  Eyes:     Pupils: Pupils are equal, round, and reactive to light.  Cardiovascular:     Rate and Rhythm: Normal rate and regular  rhythm.  Pulmonary:     Effort: Pulmonary effort is normal. No respiratory distress.     Breath sounds: Normal breath sounds. No wheezing.  Abdominal:     General: Bowel sounds are normal. There is no distension.     Palpations: Abdomen is soft. There is no mass.     Tenderness: There is no abdominal tenderness. There is no guarding or rebound.  Musculoskeletal:        General: No tenderness or edema. Normal range of motion.     Cervical back: Normal range of motion and neck supple.  Skin:    General: Skin is warm.  Neurological:     Mental Status: He is alert and oriented to person, place, and time.  Psychiatric:        Mood and Affect: Affect normal.      LABORATORY DATA:  I have reviewed the data as listed Lab Results  Component Value Date   WBC 5.2 01/28/2021   HGB 13.6  01/28/2021   HCT 40.4 01/28/2021   MCV 83.5 01/28/2021   PLT 281 01/28/2021   No results for input(s): NA, K, CL, CO2, GLUCOSE, BUN, CREATININE, CALCIUM, GFRNONAA, GFRAA, PROT, ALBUMIN, AST, ALT, ALKPHOS, BILITOT, BILIDIR, IBILI in the last 8760 hours.  RADIOGRAPHIC STUDIES: I have personally reviewed the radiological images as listed and agreed with the findings in the report. [place] OR NERVE BLOCK-IMAGE ONLY [facility])  Result Date: 01/31/2021 There is no interpretation for this exam.  This order is for images obtained during a surgical procedure.  Please See "Surgeries" Tab for more information regarding the procedure.    ASSESSMENT & PLAN:   Genetic predisposition to cancer #BRCA 2 mutation positive heterozygous [on screening].  I reviewed the importance of BRCA-2 pathogenic mutation abnormality.  Reviewed the increased risk of cancers including breast cancer; prostate cancer; melanomas; pancreatic cancer amongst others.  #I would recommend age-appropriate screening including colon cancer/PSA for prostate cancer.  Recommend dermatology evaluation on annual basis for screening of melanoma.  Will discuss with  genetic counseling regarding surveillance of pancreatic cancer/for final recommendations.  #Screening for rest of the family-daughter-x2 [mid 11s]; son 37-year-old.  As per the patient daughters have been informed for consideration of genetic testing.  He is aware that his 78-year-old son does not need to be tested at this time however could consider testing around 54 years of age.  Thank you Dr.Lanthavong for allowing me to participate in the care of your pleasant patient. Please do not hesitate to contact me with questions or concerns in the interim.  # DISPOSITION: # labs today PSA # follow up TBD- Dr.B  All questions were answered. The patient knows to call the clinic with any problems, questions or concerns.   Cammie Sickle, MD 02/07/2021 4:21 PM

## 2021-02-07 NOTE — Progress Notes (Signed)
BRCA gene positive- tests done at [facility].  Patient history of cancer with his father, but he does not know what type. Presume prostate cancer per patient.

## 2022-11-28 ENCOUNTER — Other Ambulatory Visit: Payer: Self-pay | Admitting: Family Medicine

## 2022-11-28 DIAGNOSIS — Z9189 Other specified personal risk factors, not elsewhere classified: Secondary | ICD-10-CM

## 2022-11-28 DIAGNOSIS — I10 Essential (primary) hypertension: Secondary | ICD-10-CM

## 2022-12-13 ENCOUNTER — Ambulatory Visit
Admission: RE | Admit: 2022-12-13 | Discharge: 2022-12-13 | Disposition: A | Discharge status: Home / Self Care | Payer: No Typology Code available for payment source | Source: Ambulatory Visit | Attending: Family Medicine | Admitting: Family Medicine

## 2022-12-13 DIAGNOSIS — Z9189 Other specified personal risk factors, not elsewhere classified: Secondary | ICD-10-CM

## 2022-12-13 DIAGNOSIS — I10 Essential (primary) hypertension: Secondary | ICD-10-CM

## 2024-11-13 ENCOUNTER — Ambulatory Visit

## 2024-11-13 DIAGNOSIS — Z113 Encounter for screening for infections with a predominantly sexual mode of transmission: Secondary | ICD-10-CM

## 2024-11-13 LAB — HM HIV SCREENING LAB: HM HIV Screening: NEGATIVE

## 2024-11-13 NOTE — Progress Notes (Signed)
 Pt is here for std screening. Larraine JONELLE Novak, RN

## 2024-11-13 NOTE — Progress Notes (Signed)
 [facility] Department STI clinic [address] [place] [address] Main phone: [phone]  STI screening visit  Subjective:  Marvin Cox is a 58 y.o. male being seen today for an STI screening visit. The patient reports they do not have symptoms.    Patient has the following medical conditions:  Patient Active Problem List   Diagnosis Date Noted   Genetic predisposition to cancer 02/07/2021   Chief Complaint  Patient presents with   SEXUALLY TRANSMITTED DISEASE    No symptoms    HPI Patient reports desire for STI testing. No symptoms or contacts.  See flowsheet for further details and programmatic requirements  Hyperlink available at the top of the signed note in blue.  Flow sheet content below:  Pregnancy Intention Screening Does the patient want to become pregnant in the next year?: No Does the patient's partner want to become pregnant in the next year?: No Would the patient like to discuss contraceptive options today?: No Reason For STD Screen STD Screening: Is asymptomatic Have you ever had an STD?: No History of Antibiotic use in the past 2 weeks?: No STD Symptoms Denies all: Yes Risk Factors for Hep B Household, sexual, or needle sharing contact of a person infected with Hep B: No Sexual contact with a person who uses drugs not as prescribed?: No Currently or Ever used drugs not as prescribed: No HIV Positive: No PRep Patient: No Men who have sex with men: No Have Hepatitis C: No History of Incarceration: No History of Homeslessness?: No Anal sex following anal drug use?: No Risk Factors for Hep C Currently using drugs not as prescribed: No Sexual partner(s) currently using drugs as not prescribed: No History of drug use: No HIV Positive: No People with a history of incarceration: No People born between the years of 42 and 57: No Abuse History Has patient ever been abused physically?: No Has patient ever  been abused sexually?: No Does patient feel they have a problem with Anxiety?: No Does patient feel they have a problem with Depression?: No Counseling Patient counseled to use condoms with all sex: Condoms declined RTC in 2-3 weeks for test results: Yes Clinic will call if test results abnormal before test result appt.: Yes Immunizations: Referred Test results given to patient Patient counseled to use condoms with all sex: Condoms declined  Screening for MPX risk:  Unexplained rash?  No   MSM?  No   Multiple or anonymous sex partners?  No   Any close or sexual contact with a person  diagnosed with MPX?  No   Any outside the [place]  where MPX is endemic?  No   High clinical suspicion for MPX?    -Unlikely to be chickenpox    -Lymphadenopathy    -Rash that presents in same phase of       evolution on any given body part  No   STI screening history: Last HIV test per patient/review of record was No results found for: HMHIVSCREEN No results found for: HIV  Last HEPC test per patient/review of record was No results found for: HMHEPCSCREEN No components found for: HEPC   Last HEPB test per patient/review of record was No components found for: HMHEPBSCREEN   Fertility: Does the patient or their partner desires a pregnancy in the next year? No  Immunization History  Administered Date(s) Administered   DTaP 05/20/2009   Influenza-Unspecified 12/26/2015, 10/25/2016, 09/04/2018, 10/27/2019, 09/22/2020   Moderna Sars-Covid-2 Vaccination 03/01/2020, 03/29/2020  Td 05/10/2020    The following portions of the patient's history were reviewed and updated as appropriate: allergies, current medications, past medical history, past social history, past surgical history and problem list.  Objective:  There were no vitals filed for this visit.  Physical Exam Vitals and nursing note reviewed.  Constitutional:      Appearance: Normal appearance.  HENT:     Head: Normocephalic and  atraumatic.     Mouth/Throat:     Mouth: Mucous membranes are moist.     Pharynx: No oropharyngeal exudate or posterior oropharyngeal erythema.  Eyes:     General:        Right eye: No discharge.        Left eye: No discharge.     Conjunctiva/sclera:     Right eye: Right conjunctiva is not injected. No exudate.    Left eye: Left conjunctiva is not injected. No exudate. Pulmonary:     Effort: Pulmonary effort is normal.  Abdominal:     General: Abdomen is flat.     Palpations: Abdomen is soft. There is no hepatomegaly or mass.     Tenderness: There is no abdominal tenderness. There is no rebound.  Genitourinary:    Comments: Declined genital exam- asymptomatic Lymphadenopathy:     Cervical: No cervical adenopathy.     Upper Body:     Right upper body: No supraclavicular or axillary adenopathy.     Left upper body: No supraclavicular or axillary adenopathy.  Skin:    General: Skin is warm and dry.  Neurological:     Mental Status: He is alert and oriented to person, place, and time.    Assessment and Plan:  [name] is a 58 y.o. male presenting to the [facility] Department for STI screening  1. Screening for venereal disease (Primary)  - Chlamydia/GC NAA, Confirmation - Syphilis Serology, [place] [facility] - HIV [place] [facility]   Patient does not have STI symptoms Patient accepted the following screenings: urine CT/GC, HIV, and RPR Patient meets criteria for HepB screening? No. Ordered? no Patient meets criteria for HepC screening? No. Ordered? no Recommended condom use with all sex Discussed importance of condom use for STI prevention  Treat positive test results per standing order. Discussed time line for [facility] results and that patient will be called with positive results and encouraged patient to call if he had not heard in 2 weeks Recommended repeat testing in 3 months with positive results. Recommended returning for continued or worsening symptoms.    Return for STI screening as needed.  No future appointments.  Damien FORBES Satchel, NP

## 2024-11-16 LAB — CHLAMYDIA/GC NAA, CONFIRMATION
Chlamydia trachomatis, NAA: NEGATIVE
Neisseria gonorrhoeae, NAA: NEGATIVE

## 2024-12-19 ENCOUNTER — Other Ambulatory Visit: Payer: Self-pay | Admitting: Family Medicine

## 2024-12-19 DIAGNOSIS — R911 Solitary pulmonary nodule: Secondary | ICD-10-CM

## 2024-12-23 ENCOUNTER — Ambulatory Visit
Admission: RE | Admit: 2024-12-23 | Discharge: 2024-12-23 | Disposition: A | Discharge status: Home / Self Care | Source: Ambulatory Visit | Attending: Family Medicine | Admitting: Family Medicine

## 2024-12-23 DIAGNOSIS — R911 Solitary pulmonary nodule: Secondary | ICD-10-CM

## 2025-01-08 NOTE — Progress Notes (Addendum)
" ° °  01/15/25 8:21 AM   Marvin Cox VEAR Code 05-15-1966 [id number]   HPI: 59 y.o. male here for initial evaluation of adult circumcision  Never seen a urologist Complains of superficial inner preputial skin tearing during penetrative intercourse Vaginal sex only Denies phimosis, easily retractable foreskin  History of OSA on CPAP, ED on sildenafil 50, borderline DM2    PMH: Past Medical History:  Diagnosis Date   Hypertension    PONV (postoperative nausea and vomiting)    Sleep apnea    CPAP    Surgical History: Past Surgical History:  Procedure Laterality Date   SHOULDER ARTHROSCOPY WITH ROTATOR CUFF REPAIR AND SUBACROMIAL DECOMPRESSION Left 01/31/2021   Procedure: Left shoulder arthroscopic  biceps tenodesis with  rotator cuff repair;  Surgeon: Tobie Priest, MD;  Location: [facility] ORS;  Service: Orthopedics;  Laterality: Left;  Sleep apnea   SHOULDER ARTHROSCOPY WITH SUBACROMIAL DECOMPRESSION, ROTATOR CUFF REPAIR AND BICEP TENDON REPAIR Right 06/30/2019   Procedure: SHOULDER ARTHROSCOPY WITH SUBACROMIAL DECOMPRESSION,MINI OPEN ROTATOR CUFF REPAIR AND BICEP TENDON REPAIR, SUBCAPULARIS REPAIR;  Surgeon: Tobie Priest, MD;  Location: [facility] ORS;  Service: Orthopedics;  Laterality: Right;    Family History: Family History  Problem Relation Age of Onset   Prostate cancer Father        in 37s   Breast cancer Sister    BRCA 1/2 Sister        screening    Social History:  reports that he has been smoking. He has never used smokeless tobacco. He reports current alcohol use of about 1.0 standard drink of alcohol per week. No history on file for drug use.      Physical Exam: BP (!) 146/83   Pulse 85   Ht 6' 1 (1.854 m)   Wt 220 lb (99.8 kg)   BMI 29.03 kg/m    Constitutional:  Alert and oriented, No acute distress. Cardiovascular: No clubbing, cyanosis, or edema. Respiratory: Normal respiratory effort, no increased work of breathing. GI: Nondistended GU: Uncircumcised phallus,  easily retractable foreskin.  He does not appear to have any scarred or diseased inner preputial skin.  I did note a few small superficial excoriations along the left inner preputial skin. Skin: No rashes, bruises or suspicious lesions. Neurologic: Grossly intact, no focal deficits, moving all 4 extremities. Psychiatric: Normal mood and affect.  Laboratory Data:    Component Ref Range & Units (hover) 3 yr ago  Prostatic Specific Antigen 1.23     Pertinent Imaging: N/A    Assessment & Plan:    Phimosis Assessment & Plan: Not true phimosis Minor superficial skin excoriation/stretching during penetrative intercourse  I am happy to offer an adult circumcision, I am not convinced that this would fully solve his problem.  He does not seem to be interested in circumcision for cosmetic reason or otherwise.  I would first recommend aggressive lubrication before intercourse and see if this helps.  If he does not reach his treatment goals I am happy to see him again and discuss surgery.     Hypertension, unspecified type Assessment & Plan: HTN > 140 today. Reviewed his blood pressure across two separate readings in clinic.  Recommended that patient secure close follow up with their PCP for further management        Penne Skye, MD 01/15/2025  [facility] [address] [place], [place] [address] [phone] "

## 2025-01-13 ENCOUNTER — Encounter: Payer: Self-pay | Admitting: Urology

## 2025-01-15 ENCOUNTER — Encounter: Payer: Self-pay | Admitting: Urology

## 2025-01-15 ENCOUNTER — Ambulatory Visit: Admitting: Urology

## 2025-01-15 VITALS — BP 153/79 | HR 84 | Ht 73.0 in | Wt 220.0 lb

## 2025-01-15 DIAGNOSIS — I10 Essential (primary) hypertension: Secondary | ICD-10-CM | POA: Diagnosis not present

## 2025-01-15 DIAGNOSIS — N471 Phimosis: Secondary | ICD-10-CM | POA: Diagnosis not present

## 2025-01-15 HISTORY — DX: Phimosis: N47.1

## 2025-01-15 NOTE — Addendum Note (Signed)
 Addended by: GEORGANNE PENNE SAUNDERS on: 01/15/2025 08:21 AM   Modules accepted: Level of Service

## 2025-01-15 NOTE — Assessment & Plan Note (Signed)
 Not true phimosis Minor superficial skin excoriation/stretching during penetrative intercourse  I am happy to offer an adult circumcision, I am not convinced that this would fully solve his problem.  He does not seem to be interested in circumcision for cosmetic reason or otherwise.  I would first recommend aggressive lubrication before intercourse and see if this helps.  If he does not reach his treatment goals I am happy to see him again and discuss surgery.

## 2025-01-15 NOTE — Assessment & Plan Note (Signed)
 HTN > 140 today. Reviewed his blood pressure across two separate readings in clinic.  Recommended that patient secure close follow up with their PCP for further management

## 2025-01-21 NOTE — Assessment & Plan Note (Signed)
 Not true phimosis Minor superficial skin excoriation/stretching during penetrative intercourse Would primarily be for cosmesis  He returns for follow-up quickly after last visit.  He is very much interested in proceeding with a circumcision.  Similar to our prior discussion, I am happy to offer this surgery-although it is a bit unclear if it will improve his described symptoms.  He does not seem to have true phimosis but rather superficial excoriations.  Either way, I explained the procedure in detail, expected perioperative pathway, recovery, outcomes and possible complications.  He wished to proceed, all questions answered.  -Scheduled for adult circumcision, next available

## 2025-01-21 NOTE — Progress Notes (Unsigned)
" ° °  01/22/25 11:37 AM   Marvin Cox Code 02/02/1966 [id number]   HPI: 59 y.o. male here , f/u circumcision request. Last seen 01/15/25   Prior HPI Never seen a urologist Complains of superficial inner preputial skin tearing during penetrative intercourse Vaginal sex only Denies phimosis, easily retractable foreskin  History of OSA on CPAP, ED on sildenafil 50, borderline DM2    PMH: Past Medical History:  Diagnosis Date   Hypertension    PONV (postoperative nausea and vomiting)    Sleep apnea    CPAP    Surgical History: Past Surgical History:  Procedure Laterality Date   SHOULDER ARTHROSCOPY WITH ROTATOR CUFF REPAIR AND SUBACROMIAL DECOMPRESSION Left 01/31/2021   Procedure: Left shoulder arthroscopic  biceps tenodesis with  rotator cuff repair;  Surgeon: Marvin Priest, MD;  Location: [facility] ORS;  Service: Orthopedics;  Laterality: Left;  Sleep apnea   SHOULDER ARTHROSCOPY WITH SUBACROMIAL DECOMPRESSION, ROTATOR CUFF REPAIR AND BICEP TENDON REPAIR Right 06/30/2019   Procedure: SHOULDER ARTHROSCOPY WITH SUBACROMIAL DECOMPRESSION,MINI OPEN ROTATOR CUFF REPAIR AND BICEP TENDON REPAIR, SUBCAPULARIS REPAIR;  Surgeon: Marvin Priest, MD;  Location: [facility] ORS;  Service: Orthopedics;  Laterality: Right;    Family History: Family History  Problem Relation Age of Onset   Prostate cancer Father        in 33s   Breast cancer Sister    BRCA 1/2 Sister        screening    Social History:  reports that he has been smoking. He has never used smokeless tobacco. He reports current alcohol use of about 1.0 standard drink of alcohol per week. No history on file for drug use.      Physical Exam: BP 134/83   Pulse 82   Ht 6' 1 (1.854 m)   Wt 214 lb 12.8 oz (97.4 kg)   BMI 28.34 kg/m    Constitutional:  Alert and oriented, No acute distress. Cardiovascular: No clubbing, cyanosis, or edema. Respiratory: Normal respiratory effort, no increased work of breathing. GI: Nondistended GU:  Uncircumcised phallus, easily retractable foreskin.  He does not appear to have any scarred or diseased inner preputial skin.  I did note a few small superficial excoriations along the left inner preputial skin. Skin: No rashes, bruises or suspicious lesions. Neurologic: Grossly intact, no focal deficits, moving all 4 extremities. Psychiatric: Normal mood and affect.  Laboratory Data:    Component Ref Range & Units (hover) 3 yr ago  Prostatic Specific Antigen 1.23     Pertinent Imaging: N/A    Assessment & Plan:    Phimosis Assessment & Plan: Not true phimosis Minor superficial skin excoriation/stretching during penetrative intercourse Would primarily be for cosmesis  He returns for follow-up quickly after last visit.  He is very much interested in proceeding with a circumcision.  Similar to our prior discussion, I am happy to offer this surgery-although it is a bit unclear if it will improve his described symptoms.  He does not seem to have true phimosis but rather superficial excoriations.  Either way, I explained the procedure in detail, expected perioperative pathway, recovery, outcomes and possible complications.  He wished to proceed, all questions answered.  -Scheduled for adult circumcision, next available   Orders: -     Ambulatory Referral For Surgery Scheduling       Penne Skye, MD 01/22/2025  [facility] [address] [place], [place] [address] [phone] "

## 2025-01-22 ENCOUNTER — Ambulatory Visit: Admitting: Urology

## 2025-01-22 ENCOUNTER — Other Ambulatory Visit: Payer: Self-pay

## 2025-01-22 ENCOUNTER — Telehealth: Payer: Self-pay

## 2025-01-22 VITALS — BP 134/83 | HR 82 | Ht 73.0 in | Wt 214.8 lb

## 2025-01-22 DIAGNOSIS — N471 Phimosis: Secondary | ICD-10-CM | POA: Diagnosis not present

## 2025-01-22 NOTE — Patient Instructions (Signed)
Pre-Cicumcision Instructions  STOP all aspirin or blood thinners (Aspirin, Plavix, Coumadin, Warfarin, Motrin, Ibuprofen, Advil, Aleve, Naproxen, Naprosyn) for 7 days prior to the procedure.  If you have any questions about stopping these medications please contact your primary care physician or cardiologist.  Shave all hair from the upper scrotum on the day of the procedure.  This means just under the penis onto the scrotal sac.  The area shaved should measure about 2-3 inches around.  You may lather the scrotum with soap and water, and shave with a safety razor.  After shaving the area, thoroughly wash the penis and the scrotum, then shower or bathe to remove all the loose hairs.  If needed, wash the area again just before coming in for your circumcision.  It is recommended to have a light meal an hour or so prior to the procedure.  Bring a scrotal support (jock strap or suspensory, or tight jockey shorts or underwear).  Wear comfortable pants or shorts.  Bring someone with you to drive you home.  If you have any questions or concerns, please feel free to call the office at [phone]

## 2025-01-22 NOTE — Progress Notes (Signed)
 Surgical Physician Order Form [facility]  Dr. Penne Skye, MD  * Scheduling expectation : Next Available  *Length of Case: 60 min  *Clearance needed: no  *Anticoagulation Instructions: Hold all anticoagulants  *Aspirin  Instructions: Hold Aspirin   *Post-op visit Date/Instructions:  1 month follow up  *Diagnosis: Phimosis  *Procedure: circumcision   Additional orders: N/A  -Admit type: OUTpatient  -Anesthesia: General  -VTE Prophylaxis Standing Order SCD's       Other:   -Standing Lab Orders Per Anesthesia    Lab other: None  -Standing Test orders EKG/Chest x-ray per Anesthesia       Test other:   - Medications:  Ancef  2gm IV  -Other orders:  N/A

## 2025-01-22 NOTE — Telephone Encounter (Signed)
 Per Dr. Georganne, Patient is to be scheduled for Circumcision   Mar[facility] was contacted and possible surgical dates were discussed, Tuesday February 10th, 2026 was agreed upon for surgery.    Patient was directed to call [phone] between 1-3pm the day before surgery to find out surgical arrival time.  Instructions were given not to eat or drink from midnight on the night before surgery and have a driver for the day of surgery. On the surgery day patient was instructed to enter through the Medical Mall entrance of [facility] report the Same Day Surgery desk.   Pre-Admit Testing will be in contact via phone to set up an interview with the anesthesia team to review your history and medications prior to surgery.   Reminder of this information was sent via MyChart to the patient.

## 2025-01-22 NOTE — Progress Notes (Signed)
" ° °  [facility] Surgical Posting Form  Surgery Date: Date: 02/03/2025  Surgeon: Dr. Penne Skye, MD  Inpt ( No  )   Outpt (Yes)   Obs ( No  )   Diagnosis: N47.1 Phimosis  -CPT: [phone]  Surgery: Circumcision  Stop Anticoagulations: Yes and also hold ASA  Cardiac/Medical/Pulmonary Clearance needed: no  *Orders entered into EPIC  Date: 01/22/25   *Case booked in [place]  Date: 01/22/25  *Notified pt of Surgery: Date: 01/22/25  PRE-OP UA & CX: no  *Placed into Prior Authorization Work Que Date: 01/22/25  Assistant/laser/rep:No                "

## 2025-01-28 ENCOUNTER — Inpatient Hospital Stay: Admission: RE | Admit: 2025-01-28 | Discharge: 2025-01-28 | Attending: Urology

## 2025-01-28 ENCOUNTER — Other Ambulatory Visit: Payer: Self-pay

## 2025-01-28 HISTORY — DX: Other nonspecific abnormal finding of lung field: R91.8

## 2025-01-28 HISTORY — DX: Tinea barbae and tinea capitis: B35.0

## 2025-01-28 NOTE — Patient Instructions (Addendum)
 Your procedure is scheduled on:   TUESDAY FEBRUARY 10  Report to the Registration Desk on the 1st floor of the [facility] To find out your arrival time, please call [phone] between 1PM - 3PM on:  MONDAY  FEBRUARY 9 If your arrival time is 6:00 am, do not arrive before that time as the Medical Mall entrance doors do not open until 6:00 am.  REMEMBER: Instructions that are not followed completely may result in serious medical risk, up to and including death; or upon the discretion of your surgeon and anesthesiologist your surgery may need to be rescheduled.  Do not eat food after midnight the night before surgery.  No gum chewing or hard candies.   One week prior to surgery: Stop Anti-inflammatories (NSAIDS) such as Advil, Aleve, Ibuprofen, Motrin, Naproxen, Naprosyn and Aspirin  based products such as Excedrin, Goody's Powder, BC Powder. Stop ANY OVER THE COUNTER supplements until after surgery. Multiple Vitamin (MULTIVITAMIN WITH MINERALS)  Omega-3 Fatty Acids (FISH OIL )  You may however, continue to take Tylenol  if needed for pain up until the day of surgery.  sildenafil (VIAGRA) hold 2 days prior to surgery, last dose SATURDAY FEBRUARY 7   Continue taking all of your other prescription medications up until the day of surgery.  ON THE MORNING OF SURGERY DO NOT TAKE ANY MEDICATION  No Alcohol for 24 hours before or after surgery.  No Smoking including e-cigarettes for 24 hours before surgery.   Do not use any recreational drugs for at least a week (preferably 2 weeks) before your surgery.  Please be advised that the combination of cocaine and anesthesia may have negative outcomes, up to and including death. If you test positive for cocaine, your surgery will be cancelled.  On the morning of surgery brush your teeth with toothpaste and water, you may rinse your mouth with mouthwash if you wish. Do not swallow any toothpaste or mouthwash.  Do not wear jewelry, make-up,  hairpins, clips or nail polish.  For welded (permanent) jewelry: bracelets, anklets, waist bands, etc.  Please have this removed prior to surgery.  If it is not removed, there is a chance that hospital personnel will need to cut it off on the day of surgery.  Do not wear lotions, powders, or perfumes.   Do not shave body hair from the neck down 48 hours before surgery.  Do not bring valuables to the hospital. [facility] is not responsible for any missing/lost belongings or valuables.   Notify your doctor if there is any change in your medical condition (cold, fever, infection).  Wear comfortable clothing (specific to your surgery type) to the hospital.  After surgery, you can help prevent lung complications by doing breathing exercises.  Take deep breaths and cough every 1-2 hours.   If you are being discharged the day of surgery, you will not be allowed to drive home. You will need a responsible individual to drive you home and stay with you for 24 hours after surgery.   If you are taking public transportation, you will need to have a responsible individual with you.  Please call the Pre-admissions Testing Dept. at [phone] if you have any questions about these instructions.  Surgery Visitation Policy:  Patients having surgery or a procedure may have two visitors.  Children under the age of 36 must have an adult with them who is not the patient.  Merch[employer] to address health-related social needs:  [email or web address]

## 2025-02-03 ENCOUNTER — Ambulatory Visit: Admit: 2025-02-03 | Admitting: Urology

## 2025-02-03 ENCOUNTER — Encounter: Payer: Self-pay | Admitting: Urgent Care

## 2025-02-03 ENCOUNTER — Encounter: Admission: RE | Payer: Self-pay

## 2025-03-04 ENCOUNTER — Ambulatory Visit: Admitting: Urology
